# Patient Record
Sex: Female | Born: 1935 | Race: White | Hispanic: No | Smoking: Never smoker
Health system: Southern US, Community
[De-identification: ages and names within clinical notes are randomized; demographics above are authoritative.]

## PROBLEM LIST (undated history)

## (undated) DIAGNOSIS — K219 Gastro-esophageal reflux disease without esophagitis: Secondary | ICD-10-CM

## (undated) DIAGNOSIS — F419 Anxiety disorder, unspecified: Secondary | ICD-10-CM

## (undated) DIAGNOSIS — I10 Essential (primary) hypertension: Principal | ICD-10-CM

## (undated) DIAGNOSIS — F329 Major depressive disorder, single episode, unspecified: Secondary | ICD-10-CM

## (undated) DIAGNOSIS — N882 Stricture and stenosis of cervix uteri: Secondary | ICD-10-CM

## (undated) DIAGNOSIS — G5600 Carpal tunnel syndrome, unspecified upper limb: Secondary | ICD-10-CM

## (undated) DIAGNOSIS — F0280 Dementia in other diseases classified elsewhere without behavioral disturbance: Secondary | ICD-10-CM

## (undated) DIAGNOSIS — R1310 Dysphagia, unspecified: Secondary | ICD-10-CM

## (undated) DIAGNOSIS — F32A Depression, unspecified: Secondary | ICD-10-CM

## (undated) DIAGNOSIS — N189 Chronic kidney disease, unspecified: Secondary | ICD-10-CM

## (undated) DIAGNOSIS — G2581 Restless legs syndrome: Secondary | ICD-10-CM

## (undated) DIAGNOSIS — G609 Hereditary and idiopathic neuropathy, unspecified: Secondary | ICD-10-CM

## (undated) DIAGNOSIS — G309 Alzheimer's disease, unspecified: Secondary | ICD-10-CM

## (undated) HISTORY — DX: Restless legs syndrome: G25.81

## (undated) HISTORY — DX: Major depressive disorder, single episode, unspecified: F32.9

## (undated) HISTORY — DX: Essential (primary) hypertension: I10

## (undated) HISTORY — DX: Dementia in other diseases classified elsewhere, unspecified severity, without behavioral disturbance, psychotic disturbance, mood disturbance, and anxiety: F02.80

## (undated) HISTORY — DX: Gastro-esophageal reflux disease without esophagitis: K21.9

## (undated) HISTORY — DX: Carpal tunnel syndrome, unspecified upper limb: G56.00

## (undated) HISTORY — DX: Chronic kidney disease, unspecified: N18.9

## (undated) HISTORY — DX: Hereditary and idiopathic neuropathy, unspecified: G60.9

## (undated) HISTORY — DX: Anxiety disorder, unspecified: F41.9

## (undated) HISTORY — DX: Alzheimer's disease, unspecified: G30.9

## (undated) HISTORY — DX: Depression, unspecified: F32.A

## (undated) HISTORY — DX: Stricture and stenosis of cervix uteri: N88.2

## (undated) HISTORY — DX: Dysphagia, unspecified: R13.10

---

## 2007-03-23 ENCOUNTER — Encounter: Admission: RE | Admit: 2007-03-23 | Discharge: 2007-03-23 | Payer: Self-pay | Admitting: Family Medicine

## 2008-04-02 ENCOUNTER — Encounter: Admission: RE | Admit: 2008-04-02 | Discharge: 2008-04-02 | Payer: Self-pay | Admitting: Family Medicine

## 2008-04-09 ENCOUNTER — Encounter: Admission: RE | Admit: 2008-04-09 | Discharge: 2008-04-09 | Payer: Self-pay | Admitting: Family Medicine

## 2008-04-18 ENCOUNTER — Encounter: Admission: RE | Admit: 2008-04-18 | Discharge: 2008-04-18 | Payer: Self-pay | Admitting: Family Medicine

## 2009-01-02 ENCOUNTER — Encounter: Admission: RE | Admit: 2009-01-02 | Discharge: 2009-01-02 | Payer: Self-pay | Admitting: Family Medicine

## 2009-04-03 ENCOUNTER — Encounter: Admission: RE | Admit: 2009-04-03 | Discharge: 2009-04-03 | Payer: Self-pay | Admitting: Family Medicine

## 2010-04-29 ENCOUNTER — Encounter: Admission: RE | Admit: 2010-04-29 | Payer: Self-pay | Admitting: Family Medicine

## 2010-07-26 ENCOUNTER — Encounter: Payer: Self-pay | Admitting: Family Medicine

## 2010-07-28 ENCOUNTER — Encounter: Payer: Self-pay | Admitting: Family Medicine

## 2010-07-29 ENCOUNTER — Encounter
Admission: RE | Admit: 2010-07-29 | Discharge: 2010-07-29 | Payer: Self-pay | Source: Home / Self Care | Attending: Family Medicine | Admitting: Family Medicine

## 2010-08-04 ENCOUNTER — Other Ambulatory Visit: Payer: Self-pay | Admitting: Family Medicine

## 2010-08-04 DIAGNOSIS — R928 Other abnormal and inconclusive findings on diagnostic imaging of breast: Secondary | ICD-10-CM

## 2010-08-07 ENCOUNTER — Inpatient Hospital Stay: Admission: RE | Admit: 2010-08-07 | Payer: Self-pay | Source: Ambulatory Visit

## 2010-08-22 ENCOUNTER — Other Ambulatory Visit: Payer: Self-pay

## 2012-10-14 ENCOUNTER — Non-Acute Institutional Stay (SKILLED_NURSING_FACILITY): Payer: Medicare Other | Admitting: Internal Medicine

## 2012-10-14 DIAGNOSIS — G609 Hereditary and idiopathic neuropathy, unspecified: Secondary | ICD-10-CM

## 2012-10-14 DIAGNOSIS — F0280 Dementia in other diseases classified elsewhere without behavioral disturbance: Secondary | ICD-10-CM

## 2012-10-14 DIAGNOSIS — G309 Alzheimer's disease, unspecified: Secondary | ICD-10-CM

## 2012-10-14 DIAGNOSIS — K219 Gastro-esophageal reflux disease without esophagitis: Secondary | ICD-10-CM

## 2012-10-14 DIAGNOSIS — I10 Essential (primary) hypertension: Secondary | ICD-10-CM

## 2012-11-01 NOTE — Progress Notes (Signed)
Patient ID: Beth Petersen, female   DOB: 11-Dec-1926, 79 y.o.   MRN: [id number]        PROGRESS NOTE  DATE:  10/14/2012   FACILITY: [facility]   LEVEL OF CARE: SNF  Routine Visit  CHIEF COMPLAINT:  Manage Alzheimer's dementia and peripheral neuropathy.    HISTORY OF PRESENT ILLNESS:  REASSESSMENT OF ONGOING PROBLEM(S):  DEMENTIA:  The patient is a poor historian.  Dementia is advanced.  The dementia remains stable and continues to function adequately in the current living environment with supervision.  The patient has had little changes in behavior. No complications noted from the medications presently being used.   PERIPHERAL NEUROPATHY: The peripheral neuropathy is stable. The staff denies pain in the feet, tingling, and numbness. No complications noted from the medication presently being used.   PAST MEDICAL HISTORY : Reviewed.  No changes.  CURRENT MEDICATIONS: Reviewed per [facility]  REVIEW OF SYSTEMS:  Unobtainable due to dementia.    PHYSICAL EXAMINATION  VS:  T 97      P  74    RR  20   BP 115/66   POX %     WT (Lb) 156  GENERAL: no acute distress, normal body habitus NECK: supple, trachea midline, no neck masses, no thyroid tenderness, no thyromegaly RESPIRATORY: breathing is even & unlabored, BS CTAB CARDIAC: RRR, no murmur,no extra heart sounds, no edema GI: abdomen soft, normal BS, no masses, no tenderness, no hepatomegaly, no splenomegaly PSYCHIATRIC: the patient is alert & oriented to person, affect & behavior appropriate  LABS/RADIOLOGY: 09/2012:  Potassium 4.1.    CBC normal.   CMP normal.   Depakote level 48.  07/2012:  Vitamin D level 15.6.   ASSESSMENT/PLAN:  Alzheimer's dementia.  Advanced.   Peripheral neuropathy.  Continue Neurontin.    Hypertension.  Well controlled.   GERD.  Well controlled.   Depression.  Continue Paxil.    CPT CODE: 40981

## 2012-11-04 ENCOUNTER — Non-Acute Institutional Stay (SKILLED_NURSING_FACILITY): Payer: Medicare Other | Admitting: Internal Medicine

## 2012-11-04 DIAGNOSIS — I10 Essential (primary) hypertension: Secondary | ICD-10-CM

## 2012-11-04 DIAGNOSIS — F0280 Dementia in other diseases classified elsewhere without behavioral disturbance: Secondary | ICD-10-CM

## 2012-11-04 DIAGNOSIS — G609 Hereditary and idiopathic neuropathy, unspecified: Secondary | ICD-10-CM

## 2012-11-04 DIAGNOSIS — K219 Gastro-esophageal reflux disease without esophagitis: Secondary | ICD-10-CM

## 2012-11-05 NOTE — Progress Notes (Signed)
Patient ID: Beth Petersen, female   DOB: 11-29-1926, 79 y.o.   MRN: [id number]        PROGRESS NOTE  DATE:  11/04/2012   FACILITY: [facility]   LEVEL OF CARE: SNF  Routine Visit  CHIEF COMPLAINT:  Manage Alzheimer's dementia and peripheral neuropathy.    HISTORY OF PRESENT ILLNESS:  REASSESSMENT OF ONGOING PROBLEM(S):  DEMENTIA:  The patient is a poor historian.  Dementia is advanced.  The dementia remains stable and continues to function adequately in the current living environment with supervision.  The patient has had little changes in behavior. No complications noted from the medications presently being used. Pt is a poor historian.  PERIPHERAL NEUROPATHY: The peripheral neuropathy is stable. The staff deny pain in the feet, tingling, and numbness. No complications noted from the medication presently being used.   PAST MEDICAL HISTORY : Reviewed.  No changes.  CURRENT MEDICATIONS: Reviewed per [facility]  REVIEW OF SYSTEMS:  Unobtainable due to dementia.    PHYSICAL EXAMINATION  VS:  T 98      P 72    RR 20   BP 130/82   POX %     WT (Lb) 156  GENERAL: no acute distress, normal body habitus NECK: supple, trachea midline, no neck masses, no thyroid tenderness, no thyromegaly RESPIRATORY: breathing is even & unlabored, BS CTAB CARDIAC: RRR, no murmur,no extra heart sounds, no edema GI: abdomen soft, normal BS, no masses, no tenderness, no hepatomegaly, no splenomegaly PSYCHIATRIC: the patient is alert & oriented to person, affect & behavior appropriate  LABS/RADIOLOGY: 09/2012:  Potassium 4.1.    CBC normal.   CMP normal.   Depakote level 48.  07/2012:  Vitamin D level 15.6.   ASSESSMENT/PLAN:  Alzheimer's dementia.  Advanced.   Peripheral neuropathy.  Continue Neurontin.    Hypertension.  Well controlled.   GERD.  Well controlled.   Depression.  Continue Paxil.    CPT CODE: 82956

## 2012-12-14 ENCOUNTER — Non-Acute Institutional Stay (SKILLED_NURSING_FACILITY): Payer: Medicare Other | Admitting: Internal Medicine

## 2012-12-14 DIAGNOSIS — I10 Essential (primary) hypertension: Secondary | ICD-10-CM

## 2012-12-14 DIAGNOSIS — G609 Hereditary and idiopathic neuropathy, unspecified: Secondary | ICD-10-CM

## 2012-12-14 DIAGNOSIS — F0280 Dementia in other diseases classified elsewhere without behavioral disturbance: Secondary | ICD-10-CM

## 2012-12-14 DIAGNOSIS — K219 Gastro-esophageal reflux disease without esophagitis: Secondary | ICD-10-CM

## 2012-12-16 DIAGNOSIS — K219 Gastro-esophageal reflux disease without esophagitis: Secondary | ICD-10-CM

## 2012-12-16 DIAGNOSIS — I10 Essential (primary) hypertension: Secondary | ICD-10-CM | POA: Insufficient documentation

## 2012-12-16 DIAGNOSIS — G609 Hereditary and idiopathic neuropathy, unspecified: Secondary | ICD-10-CM | POA: Insufficient documentation

## 2012-12-16 DIAGNOSIS — G309 Alzheimer's disease, unspecified: Secondary | ICD-10-CM | POA: Insufficient documentation

## 2012-12-16 HISTORY — DX: Essential (primary) hypertension: I10

## 2012-12-16 HISTORY — DX: Gastro-esophageal reflux disease without esophagitis: K21.9

## 2012-12-16 HISTORY — DX: Hereditary and idiopathic neuropathy, unspecified: G60.9

## 2012-12-16 NOTE — Progress Notes (Signed)
Patient ID: Beth Petersen, female   DOB: [birth year], 79 y.o.   MRN: [id number]        PROGRESS NOTE  DATE:  12/14/2012   FACILITY: [facility]   LEVEL OF CARE: SNF  Routine Visit  CHIEF COMPLAINT:  Manage Alzheimer's dementia and peripheral neuropathy.    HISTORY OF PRESENT ILLNESS:  REASSESSMENT OF ONGOING PROBLEM(S):  DEMENTIA:  The patient is a poor historian.  Dementia is advanced.  The dementia remains stable and continues to function adequately in the current living environment with supervision.  The patient has had little changes in behavior. No complications noted from the medications presently being used. Pt is a poor historian.  PERIPHERAL NEUROPATHY: The peripheral neuropathy is stable. The staff deny pain in the feet, tingling, and numbness. No complications noted from the medication presently being used.   PAST MEDICAL HISTORY : Reviewed.  No changes.  CURRENT MEDICATIONS: Reviewed per [facility]  REVIEW OF SYSTEMS:  Unobtainable due to dementia.    PHYSICAL EXAMINATION  VS:  T 96.7      P 90    RR 16   BP 120/85   POX %     WT (Lb) 155  GENERAL: no acute distress, normal body habitus NECK: supple, trachea midline, no neck masses, no thyroid tenderness, no thyromegaly RESPIRATORY: breathing is even & unlabored, BS CTAB CARDIAC: RRR, no murmur,no extra heart sounds, no edema GI: abdomen soft, normal BS, no masses, no tenderness, no hepatomegaly, no splenomegaly PSYCHIATRIC: the patient is alert & disoriented, affect & behavior appropriate  LABS/RADIOLOGY: 09/2012:  Potassium 4.1.    CBC normal.   CMP normal.   Depakote level 48.  07/2012:  Vitamin D level 15.6.   ASSESSMENT/PLAN:  Alzheimer's dementia.  Advanced.   Peripheral neuropathy.  Continue Neurontin.    Hypertension.  Well controlled.   GERD.  Well controlled.   Depression.  Continue Paxil.    CPT CODE: 40981

## 2013-01-11 ENCOUNTER — Non-Acute Institutional Stay (SKILLED_NURSING_FACILITY): Payer: Medicare Other | Admitting: Internal Medicine

## 2013-01-11 DIAGNOSIS — G609 Hereditary and idiopathic neuropathy, unspecified: Secondary | ICD-10-CM

## 2013-01-11 DIAGNOSIS — K219 Gastro-esophageal reflux disease without esophagitis: Secondary | ICD-10-CM

## 2013-01-11 DIAGNOSIS — I10 Essential (primary) hypertension: Secondary | ICD-10-CM

## 2013-01-11 DIAGNOSIS — G309 Alzheimer's disease, unspecified: Secondary | ICD-10-CM

## 2013-01-11 DIAGNOSIS — F0280 Dementia in other diseases classified elsewhere without behavioral disturbance: Secondary | ICD-10-CM

## 2013-01-13 NOTE — Progress Notes (Signed)
Patient ID: Beth Petersen, female   DOB: 14-May-1927, 79 y.o.   MRN: [id number]        PROGRESS NOTE  DATE:  01/11/2013   FACILITY: [facility]   LEVEL OF CARE: SNF  Routine Visit  CHIEF COMPLAINT:  Manage Alzheimer's dementia and peripheral neuropathy.    HISTORY OF PRESENT ILLNESS:  REASSESSMENT OF ONGOING PROBLEM(S):  DEMENTIA:  The patient is a poor historian.  Dementia is advanced.  The dementia remains stable and continues to function adequately in the current living environment with supervision.  The patient has had little changes in behavior. No complications noted from the medications presently being used. Pt is a poor historian.  PERIPHERAL NEUROPATHY: The peripheral neuropathy is stable. The staff deny pain in the feet, tingling, and numbness. No complications noted from the medication presently being used.   PAST MEDICAL HISTORY : Reviewed.  No changes.  CURRENT MEDICATIONS: Reviewed per [facility]  REVIEW OF SYSTEMS:  Unobtainable due to dementia.    PHYSICAL EXAMINATION  VS:  T 97.1      P 76    RR 18   BP 124/86   POX %     WT (Lb) 157  GENERAL: no acute distress, normal body habitus NECK: supple, trachea midline, no neck masses, no thyroid tenderness, no thyromegaly RESPIRATORY: breathing is even & unlabored, BS CTAB CARDIAC: RRR, no murmur,no extra heart sounds, no edema GI: abdomen soft, normal BS, no masses, no tenderness, no hepatomegaly, no splenomegaly PSYCHIATRIC: the patient is alert & disoriented, affect & behavior appropriate  LABS/RADIOLOGY: 09/2012:  Potassium 4.1.    CBC normal.   CMP normal.   Depakote level 48.  07/2012:  Vitamin D level 15.6.   ASSESSMENT/PLAN:  Alzheimer's dementia.  Advanced.   Peripheral neuropathy.  Continue Neurontin.    Hypertension.  Well controlled.   GERD.  Well controlled.   Depression.  Continue Paxil.    CPT CODE: 08657

## 2013-02-08 ENCOUNTER — Non-Acute Institutional Stay (SKILLED_NURSING_FACILITY): Payer: Medicare Other | Admitting: Internal Medicine

## 2013-02-08 DIAGNOSIS — G609 Hereditary and idiopathic neuropathy, unspecified: Secondary | ICD-10-CM

## 2013-02-08 DIAGNOSIS — G309 Alzheimer's disease, unspecified: Secondary | ICD-10-CM

## 2013-02-08 DIAGNOSIS — I10 Essential (primary) hypertension: Secondary | ICD-10-CM

## 2013-02-08 DIAGNOSIS — K219 Gastro-esophageal reflux disease without esophagitis: Secondary | ICD-10-CM

## 2013-02-08 DIAGNOSIS — F0280 Dementia in other diseases classified elsewhere without behavioral disturbance: Secondary | ICD-10-CM

## 2013-02-08 NOTE — Progress Notes (Signed)
Patient ID: Beth Petersen, female   DOB: 27-Mar-1927, 79 y.o.   MRN: [id number]        PROGRESS NOTE  DATE:  02/08/2013   FACILITY[facility]   LEVEL OF CARE: SNF  Routine Visit  CHIEF COMPLAINT:  Manage Alzheimer's dementia and peripheral neuropathy.    HISTORY OF PRESENT ILLNESS:  REASSESSMENT OF ONGOING PROBLEM(S):  DEMENTIA:  The patient is a poor historian.  Dementia is advanced.  The dementia remains stable and continues to function adequately in the current living environment with supervision.  The patient has had little changes in behavior. No complications noted from the medications presently being used. Pt is a poor historian.  PERIPHERAL NEUROPATHY: The peripheral neuropathy is stable. The staff deny pain in the feet, tingling, and numbness. No complications noted from the medication presently being used.   PAST MEDICAL HISTORY : Reviewed.  No changes.  CURRENT MEDICATIONS: Reviewed per [facility]  REVIEW OF SYSTEMS:  Unobtainable due to dementia.    PHYSICAL EXAMINATION  VS:  T 97.6      P 82    RR 18   BP 102/58   POX %     WT (Lb) 157  GENERAL: no acute distress, normal body habitus NECK: supple, trachea midline, no neck masses, no thyroid tenderness, no thyromegaly RESPIRATORY: breathing is even & unlabored, BS CTAB CARDIAC: RRR, no murmur,no extra heart sounds, no edema GI: abdomen soft, normal BS, no masses, no tenderness, no hepatomegaly, no splenomegaly PSYCHIATRIC: the patient is alert & disoriented, affect & behavior appropriate  LABS/RADIOLOGY: 09/2012:  Potassium 4.1.    CBC normal.   CMP normal.   Depakote level 48.  07/2012:  Vitamin D level 15.6.   ASSESSMENT/PLAN:  Alzheimer's dementia.  Advanced.   Peripheral neuropathy.  Continue Neurontin.    Hypertension.  Well controlled.   GERD.  Well controlled.   Depression.  Continue Paxil.    CPT CODE: 40981

## 2013-03-14 ENCOUNTER — Non-Acute Institutional Stay (SKILLED_NURSING_FACILITY): Payer: Medicare Other | Admitting: Adult Health

## 2013-03-14 DIAGNOSIS — K219 Gastro-esophageal reflux disease without esophagitis: Secondary | ICD-10-CM

## 2013-03-14 DIAGNOSIS — F0280 Dementia in other diseases classified elsewhere without behavioral disturbance: Secondary | ICD-10-CM

## 2013-03-14 DIAGNOSIS — F341 Dysthymic disorder: Secondary | ICD-10-CM

## 2013-03-14 DIAGNOSIS — I10 Essential (primary) hypertension: Secondary | ICD-10-CM

## 2013-03-14 DIAGNOSIS — F418 Other specified anxiety disorders: Secondary | ICD-10-CM

## 2013-03-14 DIAGNOSIS — G609 Hereditary and idiopathic neuropathy, unspecified: Secondary | ICD-10-CM

## 2013-03-14 DIAGNOSIS — G629 Polyneuropathy, unspecified: Secondary | ICD-10-CM

## 2013-03-23 ENCOUNTER — Non-Acute Institutional Stay (SKILLED_NURSING_FACILITY): Payer: Medicare Other | Admitting: Internal Medicine

## 2013-03-23 DIAGNOSIS — R05 Cough: Secondary | ICD-10-CM

## 2013-03-30 ENCOUNTER — Non-Acute Institutional Stay (SKILLED_NURSING_FACILITY): Payer: Medicare Other | Admitting: Internal Medicine

## 2013-03-30 DIAGNOSIS — R05 Cough: Secondary | ICD-10-CM

## 2013-03-30 NOTE — Progress Notes (Signed)
PROGRESS NOTE  DATE: 03/30/2013  FACILITY:  [facility]  LEVEL OF CARE: SNF (31)  Acute Visit  CHIEF COMPLAINT:  Manage cough  HISTORY OF PRESENT ILLNESS: I was requested by the staff to assess the patient regarding above problem(s):  Staff reports that patient is having ongoing cough and congestion despite treatment with Mucinex for one week. Patient is a poor historian due to dementia. Staff cannot identify precipitating or alleviating factors. There are no other associated signs and symptoms. There is no temporal relationship.  PAST MEDICAL HISTORY : Reviewed.  No changes.  CURRENT MEDICATIONS: Reviewed per [facility]  REVIEW OF SYSTEMS: Unobtainable due to dementia  PHYSICAL EXAMINATION  VS:  T 98.9        P 82       RR 20       BP 132/84        GENERAL: no acute distress, normal body habitus NECK: supple, trachea midline, no neck masses, no thyroid tenderness, no thyromegaly LYMPHATICS: no LAN in the neck, no supraclavicular LAN RESPIRATORY: breathing is even & unlabored, BS decreased bilaterally CARDIAC: RRR, no murmur,no extra heart sounds, no edema  ASSESSMENT/PLAN:  Cough-unstable problem. Obtain chest x-ray. Completed Mucinex.  CPT CODE: 16109

## 2013-04-13 ENCOUNTER — Non-Acute Institutional Stay (SKILLED_NURSING_FACILITY): Payer: Medicare Other | Admitting: Internal Medicine

## 2013-04-13 DIAGNOSIS — F0280 Dementia in other diseases classified elsewhere without behavioral disturbance: Secondary | ICD-10-CM

## 2013-04-13 DIAGNOSIS — G609 Hereditary and idiopathic neuropathy, unspecified: Secondary | ICD-10-CM

## 2013-04-13 DIAGNOSIS — K219 Gastro-esophageal reflux disease without esophagitis: Secondary | ICD-10-CM

## 2013-04-13 DIAGNOSIS — I10 Essential (primary) hypertension: Secondary | ICD-10-CM

## 2013-04-20 NOTE — Progress Notes (Signed)
Patient ID: Beth Petersen, female   DOB: [birth year], 79 y.o.   MRN: [id number]        PROGRESS NOTE  DATE: 03/23/2013  FACILITY:  [facility]  LEVEL OF CARE: SNF (31)  Acute Visit  CHIEF COMPLAINT:  Manage cough.    HISTORY OF PRESENT ILLNESS: I was requested by the staff to assess the patient regarding above problem(s):  Staff report that patient is having coughing and congestion as well as a runny nose, noted this morning.  Patient is a poor historian due to dementia.  Staff cannot identify precipitating or alleviating factors.  There is no temporal relationship.  There are no other associated signs and symptoms.    PAST MEDICAL HISTORY : Reviewed.  No changes.  CURRENT MEDICATIONS: Reviewed per [facility]  PHYSICAL EXAMINATION  VS:  T 98.7       P 88      RR 20      BP 124/86     POX %       WT (Lb)  GENERAL: no acute distress, normal body habitus EYES: conjunctivae normal, sclerae normal, normal eye lids NECK: supple, trachea midline, no neck masses, no thyroid tenderness, no thyromegaly LYMPHATICS: no LAN in the neck, no supraclavicular LAN RESPIRATORY: breathing is even & unlabored, BS CTAB; decreased breath sounds   CARDIAC: RRR, no murmur,no extra heart sounds, no edema  ASSESSMENT/PLAN:  Cough.  New problem.  Start Mucinex 600 mg b.i.d. for one week.    CPT CODE: 40981

## 2013-04-21 NOTE — Progress Notes (Signed)
Patient ID: Beth Petersen, female   DOB: 1927-03-07, 79 y.o.   MRN: [id number]        PROGRESS NOTE  DATE:  04/13/2013   [facility]AC[facility]   LEVEL OF CARE: SNF  Routine Visit  CHIEF COMPLAINT:  Manage Alzheimer's dementia and peripheral neuropathy.    HISTORY OF PRESENT ILLNESS:  REASSESSMENT OF ONGOING PROBLEM(S):  DEMENTIA:  The patient is a poor historian.  Dementia is advanced.  The dementia remains stable and continues to function adequately in the current living environment with supervision.  The patient has had little changes in behavior. No complications noted from the medications presently being used. Pt is a poor historian.  PERIPHERAL NEUROPATHY: The peripheral neuropathy is stable. The staff deny pain in the feet, tingling, and numbness. No complications noted from the medication presently being used.   PAST MEDICAL HISTORY : Reviewed.  No changes.  CURRENT MEDICATIONS: Reviewed per [facility]  REVIEW OF SYSTEMS:  Unobtainable due to dementia.    PHYSICAL EXAMINATION  VS:  T 98.3      P 82    RR 18   BP 147/68   POX %     WT (Lb) 153  GENERAL: no acute distress, normal body habitus NECK: supple, trachea midline, no neck masses, no thyroid tenderness, no thyromegaly RESPIRATORY: breathing is even & unlabored, BS CTAB CARDIAC: RRR, no murmur,no extra heart sounds, no edema GI: abdomen soft, normal BS, no masses, no tenderness, no hepatomegaly, no splenomegaly PSYCHIATRIC: the patient is alert & disoriented, affect & behavior appropriate  LABS/RADIOLOGY:  9-14 cbc nl, tp 5.6 ow cmp nl, depakote level 55  09/2012:  Potassium 4.1.    CBC normal.   CMP normal.   Depakote level 48.  07/2012:  Vitamin D level 15.6.   ASSESSMENT/PLAN:  Alzheimer's dementia.  Advanced.   Peripheral neuropathy.  Continue Neurontin.    Hypertension.  Well controlled.   GERD.  Well controlled.   Depression.  Continue Paxil.    CPT CODE: 40981

## 2013-05-11 ENCOUNTER — Non-Acute Institutional Stay (SKILLED_NURSING_FACILITY): Payer: Medicare Other | Admitting: Internal Medicine

## 2013-05-11 DIAGNOSIS — I10 Essential (primary) hypertension: Secondary | ICD-10-CM

## 2013-05-11 DIAGNOSIS — G609 Hereditary and idiopathic neuropathy, unspecified: Secondary | ICD-10-CM

## 2013-05-11 DIAGNOSIS — F0280 Dementia in other diseases classified elsewhere without behavioral disturbance: Secondary | ICD-10-CM

## 2013-05-11 DIAGNOSIS — K219 Gastro-esophageal reflux disease without esophagitis: Secondary | ICD-10-CM

## 2013-05-13 NOTE — Progress Notes (Signed)
Patient ID: Beth Petersen, female   DOB: 12/31/1926, 79 y.o.   MRN: [id number]        PROGRESS NOTE  DATE:  05/11/2013   [facility]ACILITY[facility]   LEVEL OF CARE: SNF  Routine Visit  CHIEF COMPLAINT:  Manage Alzheimer's dementia and peripheral neuropathy.    HISTORY OF PRESENT ILLNESS:  REASSESSMENT OF ONGOING PROBLEM(S):  DEMENTIA:  The patient is a poor historian.  Dementia is advanced.  The dementia remains stable and continues to function adequately in the current living environment with supervision.  The patient has had little changes in behavior. No complications noted from the medications presently being used. Pt is a poor historian.  PERIPHERAL NEUROPATHY: The peripheral neuropathy is stable. The staff deny pain in the feet, tingling, and numbness. No complications noted from the medication presently being used.   PAST MEDICAL HISTORY : Reviewed.  No changes.  CURRENT MEDICATIONS: Reviewed per [facility]  REVIEW OF SYSTEMS:  Unobtainable due to dementia.    PHYSICAL EXAMINATION  VS:  T 98.3      P 72    RR 18   BP 134/62   POX %     WT (Lb) 153  GENERAL: no acute distress, normal body habitus NECK: supple, trachea midline, no neck masses, no thyroid tenderness, no thyromegaly RESPIRATORY: breathing is even & unlabored, BS CTAB CARDIAC: RRR, no murmur,no extra heart sounds, no edema GI: abdomen soft, normal BS, no masses, no tenderness, no hepatomegaly, no splenomegaly PSYCHIATRIC: the patient is alert & disoriented, affect & behavior appropriate  LABS/RADIOLOGY:  9-14 cbc nl, tp 5.6 ow cmp nl, depakote level 55  09/2012:  Potassium 4.1.    CBC normal.   CMP normal.   Depakote level 48.  07/2012:  Vitamin D level 15.6.   ASSESSMENT/PLAN:  Alzheimer's dementia.  Advanced.   Peripheral neuropathy.  Continue Neurontin.    Hypertension.  Well controlled.   GERD.  Well controlled.   Depression.  Continue Paxil.    CPT CODE: 40981

## 2013-06-08 ENCOUNTER — Non-Acute Institutional Stay (SKILLED_NURSING_FACILITY): Payer: Medicare Other | Admitting: Internal Medicine

## 2013-06-08 DIAGNOSIS — F0280 Dementia in other diseases classified elsewhere without behavioral disturbance: Secondary | ICD-10-CM

## 2013-06-08 DIAGNOSIS — I10 Essential (primary) hypertension: Secondary | ICD-10-CM

## 2013-06-08 DIAGNOSIS — K219 Gastro-esophageal reflux disease without esophagitis: Secondary | ICD-10-CM

## 2013-06-08 DIAGNOSIS — G609 Hereditary and idiopathic neuropathy, unspecified: Secondary | ICD-10-CM

## 2013-06-09 ENCOUNTER — Encounter: Payer: Self-pay | Admitting: Internal Medicine

## 2013-06-09 NOTE — Progress Notes (Signed)
Patient ID: Beth Petersen, female   DOB: 02/18/1927, 79 y.o.   MRN: [id number]        PROGRESS NOTE  DATE:  06/08/2013   [facility]ACILITY[facility]   LEVEL OF CARE: SNF  Routine Visit  CHIEF COMPLAINT:  Manage Alzheimer's dementia and peripheral neuropathy.    HISTORY OF PRESENT ILLNESS:  REASSESSMENT OF ONGOING PROBLEM(S):  DEMENTIA:  The patient is a poor historian.  Dementia is advanced.  The dementia remains stable and continues to function adequately in the current living environment with supervision.  The patient has had little changes in behavior. No complications noted from the medications presently being used. Pt is a poor historian.  PERIPHERAL NEUROPATHY: The peripheral neuropathy is stable. The staff deny pain in the feet, tingling, and numbness. No complications noted from the medication presently being used.   PAST MEDICAL HISTORY : Reviewed.  No changes.  CURRENT MEDICATIONS: Reviewed per [facility]  REVIEW OF SYSTEMS:  Unobtainable due to dementia.    PHYSICAL EXAMINATION  VS:  T 98.3      P 72    RR 18   BP 134/62   POX %     WT (Lb) 153  GENERAL: no acute distress, normal body habitus NECK: supple, trachea midline, no neck masses, no thyroid tenderness, no thyromegaly RESPIRATORY: breathing is even & unlabored, BS CTAB CARDIAC: RRR, no murmur,no extra heart sounds, no edema GI: abdomen soft, normal BS, no masses, no tenderness, no hepatomegaly, no splenomegaly PSYCHIATRIC: the patient is alert & disoriented, affect & behavior appropriate  LABS/RADIOLOGY:  9-14 cbc nl, tp 5.6 ow cmp nl, depakote level 55  09/2012:  Potassium 4.1.    CBC normal.   CMP normal.   Depakote level 48.  07/2012:  Vitamin D level 15.6.   ASSESSMENT/PLAN:  Alzheimer's dementia.  Advanced.   Peripheral neuropathy.  Continue Neurontin.    Hypertension.  Well controlled.   GERD.  Well controlled.   Depression.  Continue Paxil.    CPT CODE: 40981

## 2013-06-15 ENCOUNTER — Encounter: Payer: Self-pay | Admitting: Adult Health

## 2013-06-15 NOTE — Progress Notes (Signed)
Patient ID: Beth Petersen, female   DOB: [birth year], 79 y.o.   MRN: [id number]     [place]  Allergies no known allergies    Chief Complaint  Patient presents with  . Medical Managment of Chronic Issues    HPI:  She is being seen for the management of her chronic illnesses. There are no concerns being voiced by the nursing staff at this time she is unable to fully participate in the hpi or ros. There has been no significant change in her recent overall change.   Past Medical History  Diagnosis Date  . Essential hypertension, benign 12/16/2012  . Esophageal reflux 12/16/2012  . Unspecified hereditary and idiopathic peripheral neuropathy 12/16/2012    No past surgical history on file.  VITAL SIGNS BP 132/68  Pulse 70  Ht 5\' 1"  (1.549 m)  Wt 156 lb (70.761 kg)  BMI 29.49 kg/m2   Patient's Medications  New Prescriptions   No medications on file  Previous Medications   AMLODIPINE (NORVASC) 5 MG TABLET    Take 5 mg by mouth daily.   DIVALPROEX (DEPAKOTE SPRINKLE) 125 MG CAPSULE    Take 250 mg by mouth 2 (two) times daily.   GABAPENTIN (NEURONTIN) 600 MG TABLET    Take 600 mg by mouth 3 (three) times daily.   MEMANTINE (NAMENDA) 5 MG TABLET    Take 5 mg by mouth 2 (two) times daily.   OMEPRAZOLE (PRILOSEC) 20 MG CAPSULE    Take 20 mg by mouth daily.   PAROXETINE (PAXIL) 10 MG TABLET    Take 5 mg by mouth daily.  Modified Medications   No medications on file  Discontinued Medications   No medications on file    SIGNIFICANT DIAGNOSTIC EXAMS  LABS REVIEWED  09-17-12: wbc 9.6; hgb 15.6; hct 46.4; mcv 91 plt 289; glucose 85; bun 15; creat 0.77; k+5.3; na++145 Liver normal albumin 3.7; depakote 48 3--20-14: k+ 4.1   Review of Systems  Unable to perform ROS   Physical Exam  Constitutional: She appears well-developed and well-nourished. No distress.  Neck: Neck supple. No JVD present.  Cardiovascular: Normal rate, regular rhythm and intact distal pulses.     Respiratory: Effort normal and breath sounds normal. No respiratory distress. She has no wheezes.  GI: Soft. Bowel sounds are normal. She exhibits no distension. There is no tenderness.  Musculoskeletal: She exhibits no edema.  Is able to move extremities.   Neurological: She is alert.  Skin: Skin is warm and dry. She is not diaphoretic.   ASSESSMENT/PLAN  1. Hypertension: is stable will continue norvasc 5 mg daily   2. Gerd: will continue prilosec 20 mg daily  3. Peripheral neuropathy: will continue neurontin 600 mg tid and will monitor  4. Alzheimer's disease: is without change in status; will continue namenda 5 mg twice daily and will monitor   5,depression with anxiety: will continue paxil 10 mg daily and will continue depakote 250 mg twice daily to help stabilize mood.   Will check cbc;cmp depakote next draw

## 2013-07-03 ENCOUNTER — Non-Acute Institutional Stay (SKILLED_NURSING_FACILITY): Payer: Medicare Other | Admitting: Family

## 2013-07-03 ENCOUNTER — Encounter: Payer: Self-pay | Admitting: Family

## 2013-07-03 DIAGNOSIS — I10 Essential (primary) hypertension: Secondary | ICD-10-CM

## 2013-07-03 DIAGNOSIS — G629 Polyneuropathy, unspecified: Secondary | ICD-10-CM

## 2013-07-03 DIAGNOSIS — G609 Hereditary and idiopathic neuropathy, unspecified: Secondary | ICD-10-CM

## 2013-07-03 DIAGNOSIS — F341 Dysthymic disorder: Secondary | ICD-10-CM

## 2013-07-03 DIAGNOSIS — F418 Other specified anxiety disorders: Secondary | ICD-10-CM

## 2013-07-03 DIAGNOSIS — K219 Gastro-esophageal reflux disease without esophagitis: Secondary | ICD-10-CM

## 2013-07-03 DIAGNOSIS — F0280 Dementia in other diseases classified elsewhere without behavioral disturbance: Secondary | ICD-10-CM

## 2013-07-03 NOTE — Progress Notes (Signed)
    Patient ID: Beth Petersen, female   DOB: 05-11-1927, 79 y.o.   MRN: [id number]  Date: 07/03/13 Facility: [facility]  Code Status:  DNR  Chief Complaint  Patient presents with  . Medical Managment of Chronic Issues    Routine    HPI: Pt is being followed for medical management of chronic illnesses. No issues/concerns by health care team at present.        Allergies  Allergen Reactions  . Aricept [Donepezil Hcl]   . Avelox [Moxifloxacin Hcl In Nacl]   . Penicillins      Medication List       This list is accurate as of: 07/03/13  3:29 PM.  Always use your most recent med list.               amLODipine 5 MG tablet  Commonly known as:  NORVASC  Take 5 mg by mouth daily.     divalproex 125 MG capsule  Commonly known as:  DEPAKOTE SPRINKLE  Take 250 mg by mouth 2 (two) times daily.     gabapentin 600 MG tablet  Commonly known as:  NEURONTIN  Take 600 mg by mouth 3 (three) times daily.     memantine 5 MG tablet  Commonly known as:  NAMENDA  Take 5 mg by mouth 2 (two) times daily.     omeprazole 20 MG capsule  Commonly known as:  PRILOSEC  Take 20 mg by mouth daily.     PARoxetine 10 MG tablet  Commonly known as:  PAXIL  Take 5 mg by mouth daily.         DATA REVIEWED    Laboratory Studies: 03/15/13-WBC 5.8, Hemoglobin 14.5, Hematocrit 42.5, Platelets 257     Past Medical History  Diagnosis Date  . Essential hypertension, benign 12/16/2012  . Esophageal reflux 12/16/2012  . Unspecified hereditary and idiopathic peripheral neuropathy 12/16/2012    Past Medical History  Diagnosis Date  . Essential hypertension, benign 12/16/2012  . Esophageal reflux 12/16/2012  . Unspecified hereditary and idiopathic peripheral neuropathy 12/16/2012      Review of Systems  Unable to perform ROS: dementia     Physical Exam Filed Vitals:   07/03/13 1521  BP: 128/88  Pulse: 76  Temp: 97.9 F (36.6 C)  Resp: 22   There is no weight on file to  calculate BMI. Physical Exam  Cardiovascular: Normal rate, regular rhythm and normal heart sounds.   Pulmonary/Chest: Effort normal and breath sounds normal.  Musculoskeletal:  MOEx 4   Neurological: She is alert. GCS eye subscore is 4. GCS verbal subscore is 4. GCS motor subscore is 6.  Skin: Skin is warm, dry and intact.    ASSESSMENT/PLAN  HTN- will continue Norvasc; pt is currently stable Depression-will continue Paxil; pt is stable on treatment plan Peripheral Neuropathy-will continue Neurontin Dementia-will continue Namenda; pt is currently stable GERD-will continue Prilosec; pt is stable  Follow up:prn

## 2013-07-24 ENCOUNTER — Encounter: Payer: Self-pay | Admitting: *Deleted

## 2013-08-10 ENCOUNTER — Non-Acute Institutional Stay (SKILLED_NURSING_FACILITY): Payer: Medicare Other | Admitting: Internal Medicine

## 2013-08-10 DIAGNOSIS — I10 Essential (primary) hypertension: Secondary | ICD-10-CM

## 2013-08-10 DIAGNOSIS — K219 Gastro-esophageal reflux disease without esophagitis: Secondary | ICD-10-CM

## 2013-08-10 DIAGNOSIS — G609 Hereditary and idiopathic neuropathy, unspecified: Secondary | ICD-10-CM

## 2013-08-10 DIAGNOSIS — G629 Polyneuropathy, unspecified: Secondary | ICD-10-CM

## 2013-08-10 DIAGNOSIS — F0280 Dementia in other diseases classified elsewhere without behavioral disturbance: Secondary | ICD-10-CM

## 2013-08-10 DIAGNOSIS — G309 Alzheimer's disease, unspecified: Principal | ICD-10-CM

## 2013-08-11 DIAGNOSIS — I10 Essential (primary) hypertension: Secondary | ICD-10-CM | POA: Insufficient documentation

## 2013-08-11 NOTE — Progress Notes (Signed)
Patient ID: Beth Petersen, female   DOB: [birth year], 80 y.o.   MRN: [id number]         PROGRESS NOTE  DATE:  08/10/2013   [facility]   LEVEL OF CARE: SNF  Routine Visit  CHIEF COMPLAINT:  Manage Alzheimer's dementia and peripheral neuropathy.    HISTORY OF PRESENT ILLNESS:  REASSESSMENT OF ONGOING PROBLEM(S):  DEMENTIA:  The patient is a poor historian.  Dementia is advanced.  The dementia remains stable and continues to function adequately in the current living environment with supervision.  The patient has had little changes in behavior. No complications noted from the medications presently being used. Pt is a poor historian.  PERIPHERAL NEUROPATHY: The peripheral neuropathy is stable. The staff deny pain in the feet, tingling, and numbness. No complications noted from the medication presently being used.   PAST MEDICAL HISTORY : Reviewed.  No changes.  [facility]  REVIEW OF SYSTEMS:  Unobtainable due to dementia.    PHYSICAL EXAMINATION  VS:  T 97.8      P 76    RR 20   BP 117/83    WT (Lb) 152  GENERAL: no acute distress, normal body habitus NECK: supple, trachea midline, no neck masses, no thyroid tenderness, no thyromegaly RESPIRATORY: breathing is even & unlabored, BS CTAB CARDIAC: RRR, no murmur,no extra heart sounds, no edema GI: abdomen soft, normal BS, no masses, no tenderness, no hepatomegaly, no splenomegaly PSYCHIATRIC: the patient is alert & disoriented, affect & behavior appropriate  LABS/RADIOLOGY:  9-14 cbc nl, tp 5.6 ow cmp nl, depakote level 55  09/2012:  Potassium 4.1.    CBC normal.   CMP normal.   Depakote level 48.  07/2012:  Vitamin D level 15.6.   ASSESSMENT/PLAN:  Alzheimer's dementia.  Advanced.   Peripheral neuropathy.  Continue Neurontin.    Hypertension.  Well controlled.   GERD.  Well controlled.   Depression.  Continue Paxil.    CPT CODE: [id number]

## 2013-08-14 ENCOUNTER — Encounter: Payer: Self-pay | Admitting: *Deleted

## 2013-10-10 ENCOUNTER — Encounter: Payer: Self-pay | Admitting: Adult Health

## 2013-10-10 ENCOUNTER — Non-Acute Institutional Stay (SKILLED_NURSING_FACILITY): Payer: Medicare Other | Admitting: Adult Health

## 2013-10-10 DIAGNOSIS — F0280 Dementia in other diseases classified elsewhere without behavioral disturbance: Secondary | ICD-10-CM

## 2013-10-10 DIAGNOSIS — F418 Other specified anxiety disorders: Secondary | ICD-10-CM

## 2013-10-10 DIAGNOSIS — G629 Polyneuropathy, unspecified: Secondary | ICD-10-CM

## 2013-10-10 DIAGNOSIS — F341 Dysthymic disorder: Secondary | ICD-10-CM

## 2013-10-10 DIAGNOSIS — G609 Hereditary and idiopathic neuropathy, unspecified: Secondary | ICD-10-CM

## 2013-10-10 DIAGNOSIS — I10 Essential (primary) hypertension: Secondary | ICD-10-CM

## 2013-10-10 DIAGNOSIS — K219 Gastro-esophageal reflux disease without esophagitis: Secondary | ICD-10-CM

## 2013-10-10 DIAGNOSIS — G309 Alzheimer's disease, unspecified: Secondary | ICD-10-CM

## 2013-10-10 NOTE — Progress Notes (Signed)
Patient ID: Beth Petersen, female   DOB: 03/31/1927, 80 y.o.   MRN: [id number]     [place]  Allergies  Allergen Reactions  . Aricept [Donepezil Hcl]   . Avelox [Moxifloxacin Hcl In Nacl]   . Penicillins       Chief Complaint  Patient presents with  . Medical Managment of Chronic Issues    HPI:  She is being seen for the management of her chronic illnesses. Overall there is little change in her recent status. There are no concerns being voiced by the nursing staff at this time. She is unable to fully participate in the hpi or ros.   Past Medical History  Diagnosis Date  . Essential hypertension, benign 12/16/2012  . Esophageal reflux 12/16/2012  . Unspecified hereditary and idiopathic peripheral neuropathy 12/16/2012  . Depression   . Anxiety   . Alzheimer disease   . Chronic kidney disease     hx of nephrolithasis  . Restless leg syndrome   . Cervical os stenosis   . Dysphagia   . Carpal tunnel syndrome     No past surgical history on file.  VITAL SIGNS BP 134/72  Pulse 68  Ht 5\' 1"  (1.549 m)  Wt 154 lb (69.854 kg)  BMI 29.11 kg/m2   Patient's Medications  New Prescriptions   No medications on file  Previous Medications   ACETAMINOPHEN (TYLENOL) 325 MG TABLET    Take 650 mg by mouth every 4 (four) hours as needed.   AMLODIPINE (NORVASC) 5 MG TABLET    Take 5 mg by mouth daily.   DIVALPROEX (DEPAKOTE SPRINKLE) 125 MG CAPSULE    Take 250 mg by mouth 2 (two) times daily.   GABAPENTIN (NEURONTIN) 600 MG TABLET    Take 600 mg by mouth 3 (three) times daily.   MEMANTINE (NAMENDA) 5 MG TABLET    Take 5 mg by mouth 2 (two) times daily.   OMEPRAZOLE (PRILOSEC) 20 MG CAPSULE    Take 20 mg by mouth daily.  Modified Medications   No medications on file  Discontinued Medications   PAROXETINE (PAXIL) 10 MG TABLET    Take 10 mg by mouth daily. For depression.    SIGNIFICANT DIAGNOSTIC EXAMS    LABS REVIEWED:   09-14-13: wbc 10.8; hgb 14.5; hct 42.7; mcv 91 plt  316; glucose 124; bun 14; creat 0.77; k+4.4; na++144     Review of Systems  Unable to perform ROS    Physical Exam  Constitutional: She appears well-developed and well-nourished. No distress.  Neck: Neck supple. No JVD present. No thyromegaly present.  Cardiovascular: Normal rate, regular rhythm and intact distal pulses.   Respiratory: Effort normal and breath sounds normal. No respiratory distress. She has no wheezes.  GI: Soft. Bowel sounds are normal. She exhibits no distension. There is no tenderness.  Musculoskeletal: Normal range of motion. She exhibits no edema.  Neurological: She is alert.  Skin: Skin is warm and dry. She is not diaphoretic.       ASSESSMENT/ PLAN:  1. Hypertension: is stable will continue norvasc 5 mg daily and will monitor  2. Alzheimer's disease: no significant in status; will continue namenda 5 mg twice daily. Will monitor her status   3. Depression with anxiety: is off her paxil; is taking depakote sprinkles 250 mg twice daily to help stabilize mood and will monitor is followed by nceps.   4. Genella Rife: will continue prilosec 20 mg daily   5. Peripheral neuropathy: no indications of  pain present; will continue neurontin 600 mg three times daily and will monitor         Synthia Innocenteborah Green NP [facility]  Contact [phone] Monday through Friday 8am- 5pm  After hours call [phone]

## 2013-11-29 ENCOUNTER — Non-Acute Institutional Stay (SKILLED_NURSING_FACILITY): Payer: Medicare Other | Admitting: Internal Medicine

## 2013-11-29 DIAGNOSIS — G629 Polyneuropathy, unspecified: Secondary | ICD-10-CM

## 2013-11-29 DIAGNOSIS — K219 Gastro-esophageal reflux disease without esophagitis: Secondary | ICD-10-CM

## 2013-11-29 DIAGNOSIS — F0280 Dementia in other diseases classified elsewhere without behavioral disturbance: Secondary | ICD-10-CM

## 2013-11-29 DIAGNOSIS — I10 Essential (primary) hypertension: Secondary | ICD-10-CM

## 2013-11-29 DIAGNOSIS — G309 Alzheimer's disease, unspecified: Principal | ICD-10-CM

## 2013-11-29 DIAGNOSIS — G609 Hereditary and idiopathic neuropathy, unspecified: Secondary | ICD-10-CM

## 2013-11-30 NOTE — Progress Notes (Signed)
Patient ID: Beth Petersen, female   DOB: September 17, 1926, 80 y.o.   MRN: [id number]         PROGRESS NOTE  DATE:  11/29/2013   [facility]   LEVEL OF CARE: SNF  Routine Visit  CHIEF COMPLAINT:  Manage Alzheimer's dementia, hypertension and peripheral neuropathy.    HISTORY OF PRESENT ILLNESS:  REASSESSMENT OF ONGOING PROBLEM(S):  DEMENTIA:  The patient is a poor historian.  Dementia is advanced.  The dementia remains stable and continues to function adequately in the current living environment with supervision.  The patient has had little changes in behavior. No complications noted from the medications presently being used. Pt is a poor historian.  PERIPHERAL NEUROPATHY: The peripheral neuropathy is stable. The staff deny pain in the feet, tingling, and numbness. No complications noted from the medication presently being used.   HTN: Pt 's HTN remains stable.  Staff Deny CP, sob, DOE, pedal edema, headaches, dizziness or visual disturbances.  No complications from the medications currently being used.  Last BP : 100/78  PAST MEDICAL HISTORY : Reviewed.  No changes.  CURRENT MEDICATIONS: Reviewed per [facility]  REVIEW OF SYSTEMS:  Unobtainable due to dementia.    PHYSICAL EXAMINATION  VS: see VS section  GENERAL: no acute distress, normal body habitus EYES: Normal sclerae, normal conjunctivae, no discharge NECK: supple, trachea midline, no neck masses, no thyroid tenderness, no thyromegaly LYMPHATICS: No cervical lymphadenopathy, no supraclavicular lymphadenopathy RESPIRATORY: breathing is even & unlabored, BS CTAB CARDIAC: RRR, no murmur,no extra heart sounds,  +1 bilateral lower extremity edema GI: abdomen soft, normal BS, no masses, no tenderness, no hepatomegaly, no splenomegaly PSYCHIATRIC: the patient is alert & disoriented, affect & behavior appropriate  LABS/RADIOLOGY: 3-15 CBC and CMP normal, Depakote level 35 9-14 cbc nl, tp 5.6 ow cmp nl, depakote level  55  09/2012:  Potassium 4.1.    CBC normal.   CMP normal.   Depakote level 48.  07/2012:  Vitamin D level 15.6.   ASSESSMENT/PLAN:  Alzheimer's dementia.  Advanced. Depakote was increased  Peripheral neuropathy.  Continue Neurontin.    Hypertension.  Well controlled.   GERD.  Well controlled.  Check Depakote level  CPT CODE: 69678  Newton Pigg. Kerry Dory, MD [facility] [phone]

## 2013-12-02 ENCOUNTER — Emergency Department (HOSPITAL_COMMUNITY)
Admission: EM | Admit: 2013-12-02 | Discharge: 2013-12-03 | Disposition: A | Discharge status: Home / Self Care | Payer: Medicare Other | Attending: Emergency Medicine | Admitting: Emergency Medicine

## 2013-12-02 ENCOUNTER — Encounter (HOSPITAL_COMMUNITY): Payer: Self-pay | Admitting: Emergency Medicine

## 2013-12-02 DIAGNOSIS — G309 Alzheimer's disease, unspecified: Secondary | ICD-10-CM | POA: Insufficient documentation

## 2013-12-02 DIAGNOSIS — R296 Repeated falls: Secondary | ICD-10-CM | POA: Insufficient documentation

## 2013-12-02 DIAGNOSIS — W19XXXA Unspecified fall, initial encounter: Secondary | ICD-10-CM

## 2013-12-02 DIAGNOSIS — Y9210 Unspecified residential institution as the place of occurrence of the external cause: Secondary | ICD-10-CM | POA: Insufficient documentation

## 2013-12-02 DIAGNOSIS — Y939 Activity, unspecified: Secondary | ICD-10-CM | POA: Insufficient documentation

## 2013-12-02 DIAGNOSIS — F329 Major depressive disorder, single episode, unspecified: Secondary | ICD-10-CM | POA: Insufficient documentation

## 2013-12-02 DIAGNOSIS — F3289 Other specified depressive episodes: Secondary | ICD-10-CM | POA: Insufficient documentation

## 2013-12-02 DIAGNOSIS — I10 Essential (primary) hypertension: Secondary | ICD-10-CM | POA: Insufficient documentation

## 2013-12-02 DIAGNOSIS — Z043 Encounter for examination and observation following other accident: Secondary | ICD-10-CM | POA: Insufficient documentation

## 2013-12-02 DIAGNOSIS — F0280 Dementia in other diseases classified elsewhere without behavioral disturbance: Secondary | ICD-10-CM | POA: Insufficient documentation

## 2013-12-02 DIAGNOSIS — Z87442 Personal history of urinary calculi: Secondary | ICD-10-CM | POA: Insufficient documentation

## 2013-12-02 DIAGNOSIS — F411 Generalized anxiety disorder: Secondary | ICD-10-CM | POA: Insufficient documentation

## 2013-12-02 DIAGNOSIS — N189 Chronic kidney disease, unspecified: Secondary | ICD-10-CM | POA: Insufficient documentation

## 2013-12-02 DIAGNOSIS — K219 Gastro-esophageal reflux disease without esophagitis: Secondary | ICD-10-CM | POA: Insufficient documentation

## 2013-12-02 DIAGNOSIS — Z880 Allergy status to penicillin: Secondary | ICD-10-CM | POA: Insufficient documentation

## 2013-12-02 DIAGNOSIS — Y92129 Unspecified place in nursing home as the place of occurrence of the external cause: Secondary | ICD-10-CM

## 2013-12-02 DIAGNOSIS — Z79899 Other long term (current) drug therapy: Secondary | ICD-10-CM | POA: Insufficient documentation

## 2013-12-02 DIAGNOSIS — Z8742 Personal history of other diseases of the female genital tract: Secondary | ICD-10-CM | POA: Insufficient documentation

## 2013-12-02 NOTE — ED Notes (Signed)
Bed: IL57 Expected date: 12/02/13 Expected time: 11:06 PM Means of arrival: Ambulance Comments: Fall, hip pain

## 2013-12-02 NOTE — ED Notes (Signed)
PA-C at bedside 

## 2013-12-02 NOTE — ED Notes (Signed)
Family at bedside. 

## 2013-12-02 NOTE — ED Provider Notes (Signed)
CSN: [id number]     Arrival date & time 12/02/13  2333 History   First MD Initiated Contact with Patient 12/02/13 2340     Chief Complaint  Patient presents with  . Fall     (Consider location/radiation/quality/duration/timing/severity/associated sxs/prior Treatment) HPI  Beth Petersen is a 80 y.o. female with advanced Alzheimer's disease, chronic kidney disease, dysphasia and peripheral neuropathy brought in by EMS from [facility] with report of a witnessed fall, no head trauma, LOC. Patient impacted right hip. Level V caveat secondary to dementia. Patient is accompanied by her daughter: States that she walks independently and does not require a walker. No advanced directives or DNR. As per daughter she is mentating at her baseline.  Past Medical History  Diagnosis Date  . Essential hypertension, benign 12/16/2012  . Esophageal reflux 12/16/2012  . Unspecified hereditary and idiopathic peripheral neuropathy 12/16/2012  . Depression   . Anxiety   . Alzheimer disease   . Chronic kidney disease     hx of nephrolithasis  . Restless leg syndrome   . Cervical os stenosis   . Dysphagia   . Carpal tunnel syndrome    History reviewed. No pertinent past surgical history. History reviewed. No pertinent family history. History  Substance Use Topics  . Smoking status: Never Smoker   . Smokeless tobacco: Not on file  . Alcohol Use: Not on file   OB History   Grav Para Term Preterm Abortions TAB SAB Ect Mult Living                 Review of Systems  Unable to perform ROS: Dementia      Allergies  Aricept; Avelox; and Penicillins  Home Medications   Prior to Admission medications   Medication Sig Start Date End Date Taking? Authorizing Provider  acetaminophen (TYLENOL) 325 MG tablet Take 650 mg by mouth every 4 (four) hours as needed.   Yes Historical Provider, MD  amLODipine (NORVASC) 5 MG tablet Take 5 mg by mouth daily.   Yes Historical Provider, MD   divalproex (DEPAKOTE SPRINKLE) 125 MG capsule Take 375 mg by mouth 2 (two) times daily.    Yes Historical Provider, MD  gabapentin (NEURONTIN) 600 MG tablet Take 600 mg by mouth 3 (three) times daily.   Yes Historical Provider, MD  memantine (NAMENDA) 5 MG tablet Take 5 mg by mouth 2 (two) times daily.   Yes Historical Provider, MD  omeprazole (PRILOSEC) 20 MG capsule Take 20 mg by mouth daily.   Yes Historical Provider, MD   BP 144/70  Pulse 71  Temp(Src) 98 F (36.7 C) (Oral)  Resp 18  SpO2 92% Physical Exam  Nursing note and vitals reviewed. Constitutional: She is oriented to person, place, and time. She appears well-developed and well-nourished. No distress.  HENT:  Head: Normocephalic and atraumatic.  Mouth/Throat: Oropharynx is clear and moist.  Eyes: Conjunctivae and EOM are normal. Pupils are equal, round, and reactive to light.  Neck: Normal range of motion. Neck supple.  Cardiovascular: Normal rate, regular rhythm and intact distal pulses.   Pulmonary/Chest: Effort normal and breath sounds normal. No stridor. No respiratory distress. She has no wheezes. She has no rales. She exhibits no tenderness.  Abdominal: Soft. Bowel sounds are normal. She exhibits no distension and no mass. There is no tenderness. There is no rebound and no guarding.  Musculoskeletal: Normal range of motion. She exhibits no edema and no tenderness.  Legs is equal length. No rotation. Distal  pulses intact  Neurological: She is alert and oriented to person, place, and time.  Psychiatric:  Nonverbal, does not follow commands, eyes closed    ED Course  Procedures (including critical care time) Labs Review Labs Reviewed  CBC WITH DIFFERENTIAL - Abnormal; Notable for the following:    WBC 14.4 (*)    Neutro Abs 10.0 (*)    Monocytes Absolute 1.4 (*)    All other components within normal limits  BASIC METABOLIC PANEL - Abnormal; Notable for the following:    Glucose, Bld 108 (*)    GFR calc non Af  Amer 77 (*)    GFR calc Af Amer 89 (*)    All other components within normal limits  HEPATIC FUNCTION PANEL - Abnormal; Notable for the following:    Total Bilirubin 0.2 (*)    All other components within normal limits  URINALYSIS, ROUTINE W REFLEX MICROSCOPIC - Abnormal; Notable for the following:    Hgb urine dipstick SMALL (*)    All other components within normal limits  URINE MICROSCOPIC-ADD ON - Abnormal; Notable for the following:    Squamous Epithelial / LPF FEW (*)    All other components within normal limits  LIPASE, BLOOD    Imaging Review Dg Pelvis Portable  12/03/2013   CLINICAL DATA:  Fall with left-sided hip pain  EXAM: PORTABLE PELVIS 1-2 VIEWS  COMPARISON:  None.  FINDINGS: There is no evidence of pelvic fracture or diastasis. Diffuse degenerative insertional ossification. Chronic appearing ossified body overlapping the upper right femoral neck.  IMPRESSION: No acute osseous abnormality.   Electronically Signed   By: Tiburcio PeaJonathan  Watts M.D.   On: 12/03/2013 00:26   Dg Chest Port 1 View  12/03/2013   CLINICAL DATA:  80 year old female with altered mental status and shortness of Breath. Initial encounter.  EXAM: PORTABLE CHEST - 1 VIEW  COMPARISON:  None.  FINDINGS: Portable AP semi upright view at 0020 hrs. Lung volumes are within normal limits. Normal cardiac size and mediastinal contours. Visualized tracheal air column is within normal limits. Allowing for portable technique, the lungs are clear. No pneumothorax or effusion.  IMPRESSION: No acute cardiopulmonary abnormality.   Electronically Signed   By: Augusto GambleLee  Hall M.D.   On: 12/03/2013 00:27     EKG Interpretation None      MDM   Final diagnoses:  Fall at nursing home    Filed Vitals:   12/02/13 2333 12/02/13 2335 12/03/13 0246  BP:  148/62 144/70  Pulse:  75 71  Temp:  98 F (36.7 C)   TempSrc:  Oral   Resp:  20 18  SpO2: 94% 98% 92%    [name] is a 80 y.o. female presenting with fall at skilled  nursing facility. Physical exam with no abnormalities, patient unable to supply history secondary to severe dementia. Urinalysis is not consistent with infection. CBC remarkable only for a leukocytosis of 14.4. Chest x-ray with no infiltrate and pelvis x-ray with no fracture. Results discussed with patient's daughter. No indication for admission at this time.  Evaluation does not show pathology that would require ongoing emergent intervention or inpatient treatment. Pt is hemodynamically stable and mentating appropriately. Discussed findings and plan with patient/guardian, who agrees with care plan. All questions answered. Return precautions discussed and outpatient follow up given.   This is a shared visit with the attending physician who personally evaluated the patient and agrees with the care plan.   Note: Portions of this report may have  been transcribed using voice recognition software. Every effort was made to ensure accuracy; however, inadvertent computerized transcription errors may be present     Wynetta Emery, PA-C 12/03/13 0359  Wynetta Emery, PA-C 12/03/13 [phone]

## 2013-12-02 NOTE — ED Notes (Signed)
Pt presented by EMS from [facility]. Report of witnessed fall, no head impact, no LOC, impact on right hip, no deformity noted on the affected hip, positive pedal pulses, no shortening or rotation of affected extremity on assessment. Pt asleep on arrival, no signs of distress at this time.

## 2013-12-03 ENCOUNTER — Emergency Department (HOSPITAL_COMMUNITY): Payer: Medicare Other

## 2013-12-03 LAB — CBC WITH DIFFERENTIAL/PLATELET
BASOS ABS: 0.1 10*3/uL (ref 0.0–0.1)
Basophils Relative: 0 % (ref 0–1)
EOS PCT: 3 % (ref 0–5)
Eosinophils Absolute: 0.4 10*3/uL (ref 0.0–0.7)
HEMATOCRIT: 42.1 % (ref 36.0–46.0)
HEMOGLOBIN: 14.2 g/dL (ref 12.0–15.0)
LYMPHS ABS: 2.5 10*3/uL (ref 0.7–4.0)
LYMPHS PCT: 17 % (ref 12–46)
MCH: 30.3 pg (ref 26.0–34.0)
MCHC: 33.7 g/dL (ref 30.0–36.0)
MCV: 89.8 fL (ref 78.0–100.0)
MONO ABS: 1.4 10*3/uL — AB (ref 0.1–1.0)
MONOS PCT: 10 % (ref 3–12)
NEUTROS ABS: 10 10*3/uL — AB (ref 1.7–7.7)
Neutrophils Relative %: 70 % (ref 43–77)
Platelets: 276 10*3/uL (ref 150–400)
RBC: 4.69 MIL/uL (ref 3.87–5.11)
RDW: 13.7 % (ref 11.5–15.5)
WBC: 14.4 10*3/uL — AB (ref 4.0–10.5)

## 2013-12-03 LAB — HEPATIC FUNCTION PANEL
ALK PHOS: 59 U/L (ref 39–117)
AST: 14 U/L (ref 0–37)
Albumin: 3.5 g/dL (ref 3.5–5.2)
BILIRUBIN TOTAL: 0.2 mg/dL — AB (ref 0.3–1.2)
Bilirubin, Direct: <0.2 mg/dL (ref 0.0–0.3)
TOTAL PROTEIN: 6.9 g/dL (ref 6.0–8.3)

## 2013-12-03 LAB — BASIC METABOLIC PANEL
BUN: 12 mg/dL (ref 6–23)
CHLORIDE: 104 meq/L (ref 96–112)
CO2: 28 meq/L (ref 19–32)
CREATININE: 0.72 mg/dL (ref 0.50–1.10)
Calcium: 9.5 mg/dL (ref 8.4–10.5)
GFR calc Af Amer: 89 mL/min — ABNORMAL LOW (ref >90)
GFR calc non Af Amer: 77 mL/min — ABNORMAL LOW (ref >90)
GLUCOSE: 108 mg/dL — AB (ref 70–99)
Potassium: 4.4 mEq/L (ref 3.7–5.3)
Sodium: 144 mEq/L (ref 137–147)

## 2013-12-03 LAB — URINALYSIS, ROUTINE W REFLEX MICROSCOPIC
BILIRUBIN URINE: NEGATIVE
Glucose, UA: NEGATIVE mg/dL
KETONES UR: NEGATIVE mg/dL
Leukocytes, UA: NEGATIVE
Nitrite: NEGATIVE
PH: 7.5 (ref 5.0–8.0)
Protein, ur: NEGATIVE mg/dL
SPECIFIC GRAVITY, URINE: 1.01 (ref 1.005–1.030)
UROBILINOGEN UA: 1 mg/dL (ref 0.0–1.0)

## 2013-12-03 LAB — URINE MICROSCOPIC-ADD ON

## 2013-12-03 LAB — LIPASE, BLOOD: Lipase: 56 U/L (ref 11–59)

## 2013-12-03 NOTE — ED Provider Notes (Signed)
Medical screening examination/treatment/procedure(s) were conducted as a shared visit with non-physician practitioner(s) and myself.  I personally evaluated the patient during the encounter.   EKG Interpretation None     Pt with fall at nursing home, has h/o same.  Workup here unremarkable.  Olivia Mackie, MD 12/03/13 [phone]

## 2013-12-03 NOTE — Discharge Instructions (Signed)
Please follow with your primary care doctor in the next 2 days for a check-up. They must obtain records for further management.  ° °Do not hesitate to return to the Emergency Department for any new, worsening or concerning symptoms.  ° ° °Fall Prevention and Home Safety °Falls cause injuries and can affect all age groups. It is possible to use preventive measures to significantly decrease the likelihood of falls. There are many simple measures which can make your home safer and prevent falls. °OUTDOORS °· Repair cracks and edges of walkways and driveways. °· Remove high doorway thresholds. °· Trim shrubbery on the main path into your home. °· Have good outside lighting. °· Clear walkways of tools, rocks, debris, and clutter. °· Check that handrails are not broken and are securely fastened. Both sides of steps should have handrails. °· Have leaves, snow, and ice cleared regularly. °· Use sand or salt on walkways during winter months. °· In the garage, clean up grease or oil spills. °BATHROOM °· Install night lights. °· Install grab bars by the toilet and in the tub and shower. °· Use non-skid mats or decals in the tub or shower. °· Place a plastic non-slip stool in the shower to sit on, if needed. °· Keep floors dry and clean up all water on the floor immediately. °· Remove soap buildup in the tub or shower on a regular basis. °· Secure bath mats with non-slip, double-sided rug tape. °· Remove throw rugs and tripping hazards from the floors. °BEDROOMS °· Install night lights. °· Make sure a bedside light is easy to reach. °· Do not use oversized bedding. °· Keep a telephone by your bedside. °· Have a firm chair with side arms to use for getting dressed. °· Remove throw rugs and tripping hazards from the floor. °KITCHEN °· Keep handles on pots and pans turned toward the center of the stove. Use back burners when possible. °· Clean up spills quickly and allow time for drying. °· Avoid walking on wet floors. °· Avoid  hot utensils and knives. °· Position shelves so they are not too high or low. °· Place commonly used objects within easy reach. °· If necessary, use a sturdy step stool with a grab bar when reaching. °· Keep electrical cables out of the way. °· Do not use floor polish or wax that makes floors slippery. If you must use wax, use non-skid floor wax. °· Remove throw rugs and tripping hazards from the floor. °STAIRWAYS °· Never leave objects on stairs. °· Place handrails on both sides of stairways and use them. Fix any loose handrails. Make sure handrails on both sides of the stairways are as long as the stairs. °· Check carpeting to make sure it is firmly attached along stairs. Make repairs to worn or loose carpet promptly. °· Avoid placing throw rugs at the top or bottom of stairways, or properly secure the rug with carpet tape to prevent slippage. Get rid of throw rugs, if possible. °· Have an electrician put in a light switch at the top and bottom of the stairs. °OTHER FALL PREVENTION TIPS °· Wear low-heel or rubber-soled shoes that are supportive and fit well. Wear closed toe shoes. °· When using a stepladder, make sure it is fully opened and both spreaders are firmly locked. Do not climb a closed stepladder. °· Add color or contrast paint or tape to grab bars and handrails in your home. Place contrasting color strips on first and last steps. °· Learn and use   mobility aids as needed. Install an electrical emergency response system. °· Turn on lights to avoid dark areas. Replace light bulbs that burn out immediately. Get light switches that glow. °· Arrange furniture to create clear pathways. Keep furniture in the same place. °· Firmly attach carpet with non-skid or double-sided tape. °· Eliminate uneven floor surfaces. °· Select a carpet pattern that does not visually hide the edge of steps. °· Be aware of all pets. °OTHER HOME SAFETY TIPS °· Set the water temperature for 120° F (48.8° C). °· Keep emergency numbers  on or near the telephone. °· Keep smoke detectors on every level of the home and near sleeping areas. °Document Released: 06/12/2002 Document Revised: 12/22/2011 Document Reviewed: 09/11/2011 °[employer]® Patient Information ©2014 [employer]. ° °

## 2013-12-20 ENCOUNTER — Non-Acute Institutional Stay (SKILLED_NURSING_FACILITY): Payer: Medicare Other | Admitting: Internal Medicine

## 2013-12-20 DIAGNOSIS — K219 Gastro-esophageal reflux disease without esophagitis: Secondary | ICD-10-CM

## 2013-12-20 DIAGNOSIS — G629 Polyneuropathy, unspecified: Secondary | ICD-10-CM

## 2013-12-20 DIAGNOSIS — I10 Essential (primary) hypertension: Secondary | ICD-10-CM

## 2013-12-20 DIAGNOSIS — F0280 Dementia in other diseases classified elsewhere without behavioral disturbance: Secondary | ICD-10-CM

## 2013-12-20 DIAGNOSIS — G609 Hereditary and idiopathic neuropathy, unspecified: Secondary | ICD-10-CM

## 2013-12-20 DIAGNOSIS — G309 Alzheimer's disease, unspecified: Principal | ICD-10-CM

## 2013-12-21 NOTE — Progress Notes (Signed)
Patient ID: Beth DerBetty Petersen, female   DOB: 1927-02-05, 80 y.o.   MRN: [id number]         PROGRESS NOTE  DATE:  12/20/2013   [facility]   LEVEL OF CARE: SNF  Routine Visit  CHIEF COMPLAINT:  Manage Alzheimer's dementia, hypertension and peripheral neuropathy.    HISTORY OF PRESENT ILLNESS:  REASSESSMENT OF ONGOING PROBLEM(S):  DEMENTIA:  The patient is a poor historian.  Dementia is advanced.  The dementia remains stable and continues to function adequately in the current living environment with supervision.  The patient has had little changes in behavior. No complications noted from the medications presently being used. Pt is a poor historian.  PERIPHERAL NEUROPATHY: The peripheral neuropathy is stable. The staff deny pain in the feet, tingling, and numbness. No complications noted from the medication presently being used.   HTN: Pt 's HTN remains stable.  Staff Deny CP, sob, DOE, pedal edema, headaches, dizziness or visual disturbances.  No complications from the medications currently being used.  Last BP : 100/78, 114/72  PAST MEDICAL HISTORY : Reviewed.  No changes.  [facility]  REVIEW OF SYSTEMS:  Unobtainable due to dementia.    PHYSICAL EXAMINATION  VS: see VS section  GENERAL: no acute distress, normal body habitus NECK: supple, trachea midline, no neck masses, no thyroid tenderness, no thyromegaly RESPIRATORY: breathing is even & unlabored, BS CTAB CARDIAC: RRR, no murmur,no extra heart sounds,  +1 bilateral lower extremity edema GI: abdomen soft, normal BS, no masses, no tenderness, no hepatomegaly, no splenomegaly PSYCHIATRIC: the patient is alert & disoriented, affect & behavior appropriate  LABS/RADIOLOGY: 6-15 Depakote level 42 3-15 CBC and CMP normal, Depakote level 35 9-14 cbc nl, tp 5.6 ow cmp nl, depakote level 55  09/2012:  Potassium 4.1.    CBC normal.   CMP normal.   Depakote level 48.  07/2012:  Vitamin D level  15.6.   ASSESSMENT/PLAN:  Alzheimer's dementia.  Advanced. Depakote was increased  Peripheral neuropathy.  Continue Neurontin.    Hypertension.  Well controlled.   GERD.  Well controlled.  Anxiety-trazodone was started  CPT CODE: [id number]  Newton PiggGayani Y. Kerry Doryasanayaka, MD [facility] [phone]

## 2014-01-17 ENCOUNTER — Non-Acute Institutional Stay (SKILLED_NURSING_FACILITY): Payer: Medicare Other | Admitting: Internal Medicine

## 2014-01-17 DIAGNOSIS — G309 Alzheimer's disease, unspecified: Principal | ICD-10-CM

## 2014-01-17 DIAGNOSIS — F0280 Dementia in other diseases classified elsewhere without behavioral disturbance: Secondary | ICD-10-CM

## 2014-01-17 DIAGNOSIS — I10 Essential (primary) hypertension: Secondary | ICD-10-CM

## 2014-01-17 DIAGNOSIS — K219 Gastro-esophageal reflux disease without esophagitis: Secondary | ICD-10-CM

## 2014-01-17 DIAGNOSIS — G629 Polyneuropathy, unspecified: Secondary | ICD-10-CM

## 2014-01-17 DIAGNOSIS — G609 Hereditary and idiopathic neuropathy, unspecified: Secondary | ICD-10-CM

## 2014-01-18 NOTE — Progress Notes (Signed)
Patient ID: [name], female   DOB: Aug 03, 1926, 80 y.o.   MRN: [id number]         PROGRESS NOTE  DATE:  01/17/2014   [facility]   LEVEL OF CARE: SNF  Routine Visit  CHIEF COMPLAINT:  Manage Alzheimer's dementia, hypertension and peripheral neuropathy.    HISTORY OF PRESENT ILLNESS:  REASSESSMENT OF ONGOING PROBLEM(S):  DEMENTIA:  The patient is a poor historian.  Dementia is advanced.  The dementia remains stable and continues to function adequately in the current living environment with supervision.  The patient has had little changes in behavior. No complications noted from the medications presently being used. Pt is a poor historian.  PERIPHERAL NEUROPATHY: The peripheral neuropathy is stable. The staff deny pain in the feet, tingling, and numbness. No complications noted from the medication presently being used.   HTN: Pt 's HTN remains stable.  Staff Deny CP, sob, DOE, pedal edema, headaches, dizziness or visual disturbances.  No complications from the medications currently being used.  Last BP : 100/78, 114/72, 128/80  PAST MEDICAL HISTORY : Reviewed.  No changes.  [facility]  REVIEW OF SYSTEMS:  Unobtainable due to dementia.    PHYSICAL EXAMINATION  VS: see VS section  GENERAL: no acute distress, normal body habitus NECK: supple, trachea midline, no neck masses, no thyroid tenderness, no thyromegaly RESPIRATORY: breathing is even & unlabored, BS CTAB CARDIAC: RRR, no murmur,no extra heart sounds,  +1 bilateral lower extremity edema GI: abdomen soft, normal BS, no masses, no tenderness, no hepatomegaly, no splenomegaly PSYCHIATRIC: the patient is alert & disoriented, affect & behavior appropriate  LABS/RADIOLOGY: 6-15 Depakote level 42 3-15 CBC and CMP normal, Depakote level 35 9-14 cbc nl, tp 5.6 ow cmp nl, depakote level 55  09/2012:  Potassium 4.1.    CBC normal.   CMP normal.   Depakote level 48.  07/2012:  Vitamin  D level 15.6.   ASSESSMENT/PLAN:  Alzheimer's dementia.  Advanced. Depakote was increased  Peripheral neuropathy.  Continue Neurontin.    Hypertension.  Well controlled.   GERD.  Well controlled.  Anxiety-on trazodone  CPT CODE: 5284199308  Newton PiggGayani Y. Kerry Doryasanayaka, MD [facility] [phone]

## 2014-02-12 ENCOUNTER — Non-Acute Institutional Stay (SKILLED_NURSING_FACILITY): Payer: Medicare Other | Admitting: Internal Medicine

## 2014-02-12 DIAGNOSIS — G309 Alzheimer's disease, unspecified: Principal | ICD-10-CM

## 2014-02-12 DIAGNOSIS — G609 Hereditary and idiopathic neuropathy, unspecified: Secondary | ICD-10-CM

## 2014-02-12 DIAGNOSIS — K219 Gastro-esophageal reflux disease without esophagitis: Secondary | ICD-10-CM

## 2014-02-12 DIAGNOSIS — G629 Polyneuropathy, unspecified: Secondary | ICD-10-CM

## 2014-02-12 DIAGNOSIS — F0280 Dementia in other diseases classified elsewhere without behavioral disturbance: Secondary | ICD-10-CM

## 2014-02-12 DIAGNOSIS — I10 Essential (primary) hypertension: Secondary | ICD-10-CM

## 2014-02-13 NOTE — Progress Notes (Signed)
Patient ID: Beth Petersen, female   DOB: [birth year], 80 y.o.   MRN: [id number]         PROGRESS NOTE  DATE:  02/12/2014   [facility]   LEVEL OF CARE: SNF  Routine Visit  CHIEF COMPLAINT:  Manage Alzheimer's dementia, hypertension and peripheral neuropathy.    HISTORY OF PRESENT ILLNESS:  REASSESSMENT OF ONGOING PROBLEM(S):  DEMENTIA:  The patient is a poor historian.  Dementia is advanced.  The dementia remains stable and continues to function adequately in the current living environment with supervision.  The patient has had little changes in behavior. No complications noted from the medications presently being used. Pt is a poor historian.  PERIPHERAL NEUROPATHY: The peripheral neuropathy is stable. The staff deny pain in the feet, tingling, and numbness. No complications noted from the medication presently being used.   HTN: Pt 's HTN remains stable.  Staff Deny CP, sob, DOE, pedal edema, headaches, dizziness or visual disturbances.  No complications from the medications currently being used.  Last BP : 100/78, 114/72, 128/80, 124/66  PAST MEDICAL HISTORY : Reviewed.  No changes.  [facility]  REVIEW OF SYSTEMS:  Unobtainable due to dementia.    PHYSICAL EXAMINATION  VS: see VS section  GENERAL: no acute distress, normal body habitus NECK: supple, trachea midline, no neck masses, no thyroid tenderness, no thyromegaly RESPIRATORY: breathing is even & unlabored, BS CTAB CARDIAC: RRR, no murmur,no extra heart sounds,  +1 bilateral lower extremity edema GI: abdomen soft, normal BS, no masses, no tenderness, no hepatomegaly, no splenomegaly PSYCHIATRIC: the patient is alert & disoriented, affect & behavior appropriate  LABS/RADIOLOGY: 6-15 Depakote level 42 3-15 CBC and CMP normal, Depakote level 35 9-14 cbc nl, tp 5.6 ow cmp nl, depakote level 55  09/2012:  Potassium 4.1.    CBC normal.   CMP normal.   Depakote level 48.  07/2012:   Vitamin D level 15.6.   ASSESSMENT/PLAN:  Alzheimer's dementia.  Advanced.   Peripheral neuropathy.  Continue Neurontin.    Hypertension.  Well controlled.   GERD.  Well controlled.  Anxiety-on trazodone  CPT CODE: 4098199308  Newton PiggGayani Y. Kerry Doryasanayaka, MD [facility] [phone]

## 2014-03-23 ENCOUNTER — Non-Acute Institutional Stay (SKILLED_NURSING_FACILITY): Payer: Medicare Other | Admitting: Internal Medicine

## 2014-03-23 DIAGNOSIS — I10 Essential (primary) hypertension: Secondary | ICD-10-CM

## 2014-03-23 DIAGNOSIS — G629 Polyneuropathy, unspecified: Secondary | ICD-10-CM

## 2014-03-23 DIAGNOSIS — K219 Gastro-esophageal reflux disease without esophagitis: Secondary | ICD-10-CM

## 2014-03-23 DIAGNOSIS — G309 Alzheimer's disease, unspecified: Principal | ICD-10-CM

## 2014-03-23 DIAGNOSIS — F0280 Dementia in other diseases classified elsewhere without behavioral disturbance: Secondary | ICD-10-CM

## 2014-03-23 DIAGNOSIS — G609 Hereditary and idiopathic neuropathy, unspecified: Secondary | ICD-10-CM

## 2014-03-23 NOTE — Progress Notes (Signed)
Patient ID: Miraya Cudney, female   DOB: 1927-07-02, 80 y.o.   MRN: [id number]         PROGRESS NOTE  DATE:  03/23/2014   [facility]   LEVEL OF CARE: SNF  Routine Visit  CHIEF COMPLAINT:  Manage Alzheimer's dementia, hypertension and peripheral neuropathy.    HISTORY OF PRESENT ILLNESS:  REASSESSMENT OF ONGOING PROBLEM(S):  DEMENTIA:  The patient is a poor historian.  Dementia is advanced.  The dementia remains stable and continues to function adequately in the current living environment with supervision.  The patient has had little changes in behavior. No complications noted from the medications presently being used. Pt is a poor historian.  PERIPHERAL NEUROPATHY: The peripheral neuropathy is stable. The staff deny pain in the feet, tingling, and numbness. No complications noted from the medication presently being used.   HTN: Pt 's HTN remains stable.  Staff Deny CP, sob, DOE, pedal edema, headaches, dizziness or visual disturbances.  No complications from the medications currently being used.  Last BP : 100/78, 114/72, 128/80, 124/66, 104/72  PAST MEDICAL HISTORY : Reviewed.  No changes.  CURRENT MEDICATIONS: Reviewed per [facility]  REVIEW OF SYSTEMS:  Unobtainable due to dementia.    PHYSICAL EXAMINATION  VS: see VS section  GENERAL: no acute distress, normal body habitus NECK: supple, trachea midline, no neck masses, no thyroid tenderness, no thyromegaly RESPIRATORY: breathing is even & unlabored, BS CTAB CARDIAC: RRR, no murmur,no extra heart sounds,  +1 bilateral lower extremity edema GI: abdomen soft, normal BS, no masses, no tenderness, no hepatomegaly, no splenomegaly PSYCHIATRIC: the patient is alert & disoriented, affect & behavior appropriate  LABS/RADIOLOGY: 9-15 CBC and CMP normal, Depakote level 75 6-15 Depakote level 42 3-15 CBC and CMP normal, Depakote level 35 9-14 cbc nl, tp 5.6 ow cmp nl, depakote level 55  09/2012:  Potassium 4.1.    CBC normal.    CMP normal.   Depakote level 48.  07/2012:  Vitamin D level 15.6.   ASSESSMENT/PLAN:  Alzheimer's dementia.  Advanced.   Peripheral neuropathy.  Continue Neurontin.    Hypertension.  Well controlled.   GERD.  Well controlled.  Anxiety-on trazodone  Depression-continue Remeron  CPT CODE: 40981  Newton Pigg. Kerry Dory, MD [facility] [phone]

## 2014-04-18 ENCOUNTER — Non-Acute Institutional Stay (SKILLED_NURSING_FACILITY): Payer: Medicare Other | Admitting: Internal Medicine

## 2014-04-18 DIAGNOSIS — I10 Essential (primary) hypertension: Secondary | ICD-10-CM

## 2014-04-18 DIAGNOSIS — G629 Polyneuropathy, unspecified: Secondary | ICD-10-CM

## 2014-04-18 DIAGNOSIS — F0280 Dementia in other diseases classified elsewhere without behavioral disturbance: Secondary | ICD-10-CM

## 2014-04-18 DIAGNOSIS — K219 Gastro-esophageal reflux disease without esophagitis: Secondary | ICD-10-CM

## 2014-04-18 DIAGNOSIS — G309 Alzheimer's disease, unspecified: Secondary | ICD-10-CM

## 2014-04-20 NOTE — Progress Notes (Signed)
Patient ID: Beth Petersen, female   DOB: 10/19/1926, 80 y.o.   MRN: [id number]         PROGRESS NOTE  DATE:  04/18/2014   [facility]   LEVEL OF CARE: SNF  Routine Visit  CHIEF COMPLAINT:  Manage Alzheimer's dementia, hypertension and peripheral neuropathy.    HISTORY OF PRESENT ILLNESS:  REASSESSMENT OF ONGOING PROBLEM(S):  DEMENTIA:  The patient is a poor historian.  Dementia is advanced.  The dementia remains stable and continues to function adequately in the current living environment with supervision.  The patient has had little changes in behavior. No complications noted from the medications presently being used. Pt is a poor historian.  PERIPHERAL NEUROPATHY: The peripheral neuropathy is stable. The staff deny pain in the feet, tingling, and numbness. No complications noted from the medication presently being used.   HTN: Pt 's HTN remains stable.  Staff Deny CP, sob, DOE, pedal edema, headaches, dizziness or visual disturbances.  No complications from the medications currently being used.  Last BP : 100/78, 114/72, 128/80, 124/66, 104/72, 104/78  PAST MEDICAL HISTORY : Reviewed.  No changes.  [facility]S: [facility]  REVIEW OF SYSTEMS:  Unobtainable due to dementia.    PHYSICAL EXAMINATION  VS: see VS section  GENERAL: no acute distress, normal body habitus NECK: supple, trachea midline, no neck masses, no thyroid tenderness, no thyromegaly RESPIRATORY: breathing is even & unlabored, BS CTAB CARDIAC: RRR, no murmur,no extra heart sounds,  +1 bilateral lower extremity edema GI: abdomen soft, normal BS, no masses, no tenderness, no hepatomegaly, no splenomegaly PSYCHIATRIC: the patient is alert & disoriented, affect & behavior appropriate  LABS/RADIOLOGY: 9-15 CBC and CMP normal, Depakote level 75 6-15 Depakote level 42 3-15 CBC and CMP normal, Depakote level 35 9-14 cbc nl, tp 5.6 ow cmp nl, depakote level 55  09/2012:  Potassium 4.1.     CBC normal.   CMP normal.   Depakote level 48.  07/2012:  Vitamin D level 15.6.   ASSESSMENT/PLAN:  Alzheimer's dementia.  Advanced.   Peripheral neuropathy.  Continue Neurontin.    Hypertension.  Well controlled.   GERD.  Well controlled.  Anxiety-on trazodone  Depression-continue Remeron  CPT CODE: [id number]  Newton PiggGayani Y. Kerry Doryasanayaka, MD [facility] [phone]

## 2016-10-10 ENCOUNTER — Encounter (HOSPITAL_COMMUNITY): Payer: Self-pay | Admitting: Emergency Medicine

## 2016-10-10 ENCOUNTER — Emergency Department (HOSPITAL_COMMUNITY)
Admission: EM | Admit: 2016-10-10 | Discharge: 2016-10-10 | Disposition: A | Discharge status: Home / Self Care | Payer: Medicare Other | Attending: Emergency Medicine | Admitting: Emergency Medicine

## 2016-10-10 ENCOUNTER — Emergency Department (HOSPITAL_COMMUNITY): Payer: Medicare Other

## 2016-10-10 DIAGNOSIS — Z79899 Other long term (current) drug therapy: Secondary | ICD-10-CM | POA: Insufficient documentation

## 2016-10-10 DIAGNOSIS — G309 Alzheimer's disease, unspecified: Secondary | ICD-10-CM | POA: Diagnosis not present

## 2016-10-10 DIAGNOSIS — Y999 Unspecified external cause status: Secondary | ICD-10-CM | POA: Diagnosis not present

## 2016-10-10 DIAGNOSIS — W19XXXA Unspecified fall, initial encounter: Secondary | ICD-10-CM

## 2016-10-10 DIAGNOSIS — Z23 Encounter for immunization: Secondary | ICD-10-CM | POA: Diagnosis not present

## 2016-10-10 DIAGNOSIS — W050XXA Fall from non-moving wheelchair, initial encounter: Secondary | ICD-10-CM | POA: Insufficient documentation

## 2016-10-10 DIAGNOSIS — Y939 Activity, unspecified: Secondary | ICD-10-CM | POA: Insufficient documentation

## 2016-10-10 DIAGNOSIS — Y92129 Unspecified place in nursing home as the place of occurrence of the external cause: Secondary | ICD-10-CM | POA: Insufficient documentation

## 2016-10-10 DIAGNOSIS — N189 Chronic kidney disease, unspecified: Secondary | ICD-10-CM | POA: Diagnosis not present

## 2016-10-10 DIAGNOSIS — S0101XA Laceration without foreign body of scalp, initial encounter: Secondary | ICD-10-CM | POA: Diagnosis not present

## 2016-10-10 DIAGNOSIS — I129 Hypertensive chronic kidney disease with stage 1 through stage 4 chronic kidney disease, or unspecified chronic kidney disease: Secondary | ICD-10-CM | POA: Insufficient documentation

## 2016-10-10 DIAGNOSIS — S0990XA Unspecified injury of head, initial encounter: Secondary | ICD-10-CM | POA: Diagnosis present

## 2016-10-10 MED ORDER — TETANUS-DIPHTH-ACELL PERTUSSIS 5-2.5-18.5 LF-MCG/0.5 IM SUSP
0.5000 mL | Freq: Once | INTRAMUSCULAR | Status: AC
  Administered 2016-10-10: 0.5 mL via INTRAMUSCULAR
  Filled 2016-10-10: qty 0.5

## 2016-10-10 MED ORDER — BACITRACIN ZINC 500 UNIT/GM EX OINT
TOPICAL_OINTMENT | CUTANEOUS | Status: AC
  Administered 2016-10-10: 1
  Filled 2016-10-10: qty 0.9

## 2016-10-10 MED ORDER — LIDOCAINE-EPINEPHRINE (PF) 2 %-1:200000 IJ SOLN
10.0000 mL | Freq: Once | INTRAMUSCULAR | Status: AC
  Administered 2016-10-10: 10 mL
  Filled 2016-10-10: qty 20

## 2016-10-10 NOTE — ED Provider Notes (Signed)
[facility]-EMERGENCY DEPT Provider Note   CSN: [id number] Arrival date & time: 10/10/16  1353     History   Chief Complaint Chief Complaint  Patient presents with  . Laceration    HPI Beth Petersen is a 83 y.o. female.  HPI  83 y.o. female with a hx of Alzheimer Disease, CKD, presents to the Emergency Department today from [facility] due to mechanical fall after leaning forward too far from wheelchair. This was witnessed by staff. Noted head trauma without LOC. No emesis. Pt baseline per daughter in room. Suffered laceration to forehead around 2 inches. Pt not on blood thinners. Pt is non verbal at baseline.   Level V Caveat: Dementia  Past Medical History:  Diagnosis Date  . Alzheimer disease   . Anxiety   . Carpal tunnel syndrome   . Cervical os stenosis   . Chronic kidney disease    hx of nephrolithasis  . Depression   . Dysphagia   . Esophageal reflux 12/16/2012  . Essential hypertension, benign 12/16/2012  . Restless leg syndrome   . Unspecified hereditary and idiopathic peripheral neuropathy 12/16/2012    Patient Active Problem List   Diagnosis Date Noted  . Depression with anxiety 03/14/2013  . Peripheral neuropathy (HCC) 03/14/2013  . Alzheimer's disease 12/16/2012  . Essential hypertension, benign 12/16/2012  . Esophageal reflux 12/16/2012    History reviewed. No pertinent surgical history.  OB History    No data available       Home Medications    Prior to Admission medications   Medication Sig Start Date End Date Taking? Authorizing Provider  acetaminophen (TYLENOL) 325 MG tablet Take 650 mg by mouth every 4 (four) hours as needed.    Historical Provider, MD  amLODipine (NORVASC) 5 MG tablet Take 5 mg by mouth daily.    Historical Provider, MD  divalproex (DEPAKOTE SPRINKLE) 125 MG capsule Take 375 mg by mouth 2 (two) times daily.     Historical Provider, MD  gabapentin (NEURONTIN) 600 MG tablet Take 600 mg by mouth 3 (three) times daily.    Historical  Provider, MD  memantine (NAMENDA) 5 MG tablet Take 5 mg by mouth 2 (two) times daily.    Historical Provider, MD  omeprazole (PRILOSEC) 20 MG capsule Take 20 mg by mouth daily.    Historical Provider, MD    Family History History reviewed. No pertinent family history.  Social History Social History  Substance Use Topics  . Smoking status: Never Smoker  . Smokeless tobacco: Not on file  . Alcohol use Not on file     Allergies   Aricept [donepezil hcl]; Avelox [moxifloxacin hcl in nacl]; and Penicillins   Review of Systems Review of Systems  Unable to perform ROS: Dementia   Physical Exam Updated Vital Signs BP 120/80 (BP Location: Left Arm)   Pulse 70   Temp 97.8 F (36.6 C) (Oral)   Resp 20   Ht  (1.549 m)   Wt 68.9 kg   SpO2 100%   BMI 28.72 kg/m   Physical Exam  Constitutional: She is oriented to person, place, and time. Vital signs are normal. She appears well-developed and well-nourished. No distress.  HENT:  Head: Normocephalic and atraumatic. Head is without raccoon's eyes and without Battle's sign.  Right Ear: Hearing normal. No hemotympanum.  Left Ear: Hearing normal. No hemotympanum.  Nose: Nose normal.  Mouth/Throat: Uvula is midline, oropharynx is clear and moist and mucous membranes are normal.  Eyes: Conjunctivae and  EOM are normal. Pupils are equal, round, and reactive to light.  Pupils equal and reactive  Neck: Trachea normal and normal range of motion. Neck supple. No spinous process tenderness and no muscular tenderness present. No tracheal deviation and normal range of motion present.  Cardiovascular: Normal rate, regular rhythm, S1 normal, S2 normal, normal heart sounds, intact distal pulses and normal pulses.   Pulmonary/Chest: Effort normal and breath sounds normal. No respiratory distress. She has no decreased breath sounds. She has no wheezes. She has no rhonchi. She has no rales.  Abdominal: Soft. Normal appearance and bowel sounds are  normal. There is no tenderness. There is no rigidity and no guarding.  Musculoskeletal: Normal range of motion.  Moving x 4 extremities without difficulty  Neurological: She is alert and oriented to person, place, and time. She has normal strength. No cranial nerve deficit or sensory deficit.  Skin: Skin is warm and dry.  2 inch laceration across forehead. Bottom of wound visualized. Bleeding controlled.   Psychiatric: She has a normal mood and affect. Her speech is normal and behavior is normal. Thought content normal.  Nursing note and vitals reviewed.  ED Treatments / Results  Labs (all labs ordered are listed, but only abnormal results are displayed) Labs Reviewed - No data to display  EKG  EKG Interpretation None       Radiology Ct Head Wo Contrast  Result Date: 10/10/2016 CLINICAL DATA:  Fall with forehead laceration. Dementia. Initial encounter. EXAM: CT HEAD WITHOUT CONTRAST CT CERVICAL SPINE WITHOUT CONTRAST TECHNIQUE: Multidetector CT imaging of the head and cervical spine was performed following the standard protocol without intravenous contrast. Multiplanar CT image reconstructions of the cervical spine were also generated. COMPARISON:  None. FINDINGS: CT HEAD FINDINGS Brain: No evidence of acute infarction, hemorrhage, hydrocephalus, extra-axial collection or mass lesion/mass effect. Atrophy with ventriculomegaly. Medial temporal volume loss is severe, correlating with history of Alzheimer's disease. Vascular: No hyperdense vessel or unexpected calcification. Skull: Left forehead laceration without underlying fracture or opaque foreign body. Sinuses/Orbits: The majority of the right sphenoid sinus is opacified, without expansion. CT CERVICAL SPINE FINDINGS Alignment: No traumatic malalignment. Skull base and vertebrae: Negative for fracture Soft tissues and spinal canal: No prevertebral fluid or swelling. No visible canal hematoma. Disc levels: Facet arthropathy with ankylosis on  the left from C2-C4. Spondylosis with bulky lower endplate spurring. Upper chest: No acute finding IMPRESSION: 1. No evidence of intracranial or cervical spine injury. 2. Forehead laceration without fracture. 3. Brain atrophy in keeping with history of Alzheimer's disease. Electronically Signed   By: Marnee Spring M.D.   On: 10/10/2016 15:51   Ct Cervical Spine Wo Contrast  Result Date: 10/10/2016 CLINICAL DATA:  Fall with forehead laceration. Dementia. Initial encounter. EXAM: CT HEAD WITHOUT CONTRAST CT CERVICAL SPINE WITHOUT CONTRAST TECHNIQUE: Multidetector CT imaging of the head and cervical spine was performed following the standard protocol without intravenous contrast. Multiplanar CT image reconstructions of the cervical spine were also generated. COMPARISON:  None. FINDINGS: CT HEAD FINDINGS Brain: No evidence of acute infarction, hemorrhage, hydrocephalus, extra-axial collection or mass lesion/mass effect. Atrophy with ventriculomegaly. Medial temporal volume loss is severe, correlating with history of Alzheimer's disease. Vascular: No hyperdense vessel or unexpected calcification. Skull: Left forehead laceration without underlying fracture or opaque foreign body. Sinuses/Orbits: The majority of the right sphenoid sinus is opacified, without expansion. CT CERVICAL SPINE FINDINGS Alignment: No traumatic malalignment. Skull base and vertebrae: Negative for fracture Soft tissues and spinal canal: No prevertebral  fluid or swelling. No visible canal hematoma. Disc levels: Facet arthropathy with ankylosis on the left from C2-C4. Spondylosis with bulky lower endplate spurring. Upper chest: No acute finding IMPRESSION: 1. No evidence of intracranial or cervical spine injury. 2. Forehead laceration without fracture. 3. Brain atrophy in keeping with history of Alzheimer's disease. Electronically Signed   By: Marnee Spring M.D.   On: 10/10/2016 15:51    Procedures .Marland KitchenLaceration Repair Date/Time: 10/10/2016  3:14 PM Performed by: Audry Pili Authorized by: Audry Pili   Consent:    Consent given by:  Guardian   Alternatives discussed:  No treatment Anesthesia (see MAR for exact dosages):    Anesthesia method:  Local infiltration   Local anesthetic:  Lidocaine 1% WITH epi Laceration details:    Location:  Scalp   Scalp location:  Frontal   Length (cm):  5 Pre-procedure details:    Preparation:  Patient was prepped and draped in usual sterile fashion and imaging obtained to evaluate for foreign bodies Exploration:    Hemostasis achieved with:  Direct pressure   Wound exploration: entire depth of wound probed and visualized   Treatment:    Area cleansed with:  Betadine, Hibiclens and saline   Amount of cleaning:  Extensive   Irrigation solution:  Sterile saline   Irrigation method:  Pressure wash Skin repair:    Repair method:  Sutures   Suture size:  4-0   Suture material:  Prolene   Suture technique:  Simple interrupted   Number of sutures:  7 Post-procedure details:    Dressing:  Antibiotic ointment and non-adherent dressing   Patient tolerance of procedure:  Tolerated well, no immediate complications   (including critical care time)  Medications Ordered in ED Medications  lidocaine-EPINEPHrine (XYLOCAINE W/EPI) 2 %-1:200000 (PF) injection 10 mL (not administered)  Tdap (BOOSTRIX) injection 0.5 mL (not administered)  bacitracin 500 UNIT/GM ointment (not administered)     Initial Impression / Assessment and Plan / ED Course  I have reviewed the triage vital signs and the nursing notes.  Pertinent labs & imaging results that were available during my care of the patient were reviewed by me and considered in my medical decision making (see chart for details).  Final Clinical Impressions(s) / ED Diagnoses   {I have reviewed and evaluated the relevant imaging studies.  {I have reviewed the relevant previous healthcare records.  {I obtained HPI from historian. {Patient  discussed with supervising physician.  ED Course:  Assessment: Pt is a 83 y.o. female with hx Dementia who presents with mechanical fall from [facility] nursing facility. Noted leaning forward on wheelchair and falling forward. Witnessed. No LOC. Suffered laceration on forehead. Bleeding controlled with direct pressure. Pt not on anticoagulation. On exam, pt in NAD. Nontoxic/nonseptic appearing. VSS. Afebrile. Lungs CTA. Heart RRR. Abdomen nontender soft. Laceration 2 inches long. Bottom of wound visualized. CT Head/C Spine unremarkable. Tdap booster given. Pressure irrigation performed. Bottom of the wound visualized with bleeding controled. Laceration occurred < 8 hours prior to repair which was well tolerated. Pt has no co morbidities to effect normal wound healing. Discussed suture home care w pt and answered questions. Pt to f-u for wound check and suture removal in 5-8 days. Pt is hemodynamically stable w no complaints prior to dc.   Disposition/Plan:  DC Home Additional Verbal discharge instructions given and discussed with patient.  Pt Instructed to f/u with PCP in the next week for evaluation and treatment of symptoms. Return precautions given Pt acknowledges  and agrees with plan  Supervising Physician Lyndal Pulley, MD  Final diagnoses:  Fall, initial encounter  Laceration of scalp, initial encounter    New Prescriptions New Prescriptions   No medications on file     Audry Pili, PA-C 10/10/16 1600    Lyndal Pulley, MD 10/11/16 [phone]

## 2016-10-10 NOTE — Discharge Instructions (Signed)
Please read and follow all provided instructions.  Your diagnoses today include:  1. Fall, initial encounter   2. Laceration of scalp, initial encounter    Tests performed today include: CT of the affected area that did not show any foreign bodies or broken bones Vital signs. See below for your results today.   Medications prescribed:   Take any prescribed medications only as directed.   Home care instructions:  Follow any educational materials and wound care instructions contained in this packet.   You may shower and wash the area with soap and water, just be sure to pat the area dry and not rub over the stitches. Do no put your stiches underwater (in a bath, pool, or lake). Getting stiches wet can slow down healing and increase your chances of getting an infection. You may apply Bacitracin or Neosporin twice a day for 7 days, and keep the ara clean with  bandage or gauze. Do not apply alcohol or hydrogen peroxide. Cover the area if it draining or weeping.   Follow-up instructions: Suture Removal: Return to the Emergency Department or see your primary care care doctor in 5-8 days for a recheck of your wound and removal of your sutures or staples.    Return instructions:  Return to the Emergency Department if you have: Fever Worsening pain Worsening swelling of the wound Pus draining from the wound Redness of the skin that moves away from the wound, especially if it streaks away from the affected area  Any other emergent concerns  Your vital signs today were: BP 120/80 (BP Location: Left Arm)    Pulse 70    Temp 97.8 F (36.6 C) (Oral)    Resp 20    Ht  (1.549 m)    Wt 68.9 kg    SpO2 100%    BMI 28.72 kg/m  If your blood pressure (BP) was elevated above 135/85 this visit, please have this repeated by your doctor within one month. --------------

## 2016-10-10 NOTE — ED Triage Notes (Signed)
Per EMS, pt is from [facility] and has a hx of dementia. Pt was in wheelchair at facility, leaned forward, and fell out of chair, and sustained a laceration on forehead. Pt is not on blood thinners and did not lose consciousness.  Presents with 2" lac to forehead that appears to be to the bone.  Pt is nonverbal at baseline.  No obvious distress at this time.

## 2016-10-10 NOTE — ED Notes (Signed)
Bed: ZO10 Expected date:  Expected time:  Means of arrival:  Comments: 83 yo fall, head lac

## 2017-11-03 DEATH — deceased

## 2018-06-29 IMAGING — CT CT HEAD W/O CM
6 of 8 series · 17 of 47 positions shown, 18 images · non-contrast
Comparison: None.

CLINICAL DATA: Fall with forehead laceration. Dementia. Initial
encounter.

EXAM:
CT HEAD WITHOUT CONTRAST
CT CERVICAL SPINE WITHOUT CONTRAST
TECHNIQUE: Multidetector CT imaging of the head and cervical spine was
performed following the standard protocol without intravenous
contrast. Multiplanar CT image reconstructions of the cervical spine
were also generated.

[Series 3: head w/o · axial · non-contrast · 0.43mm/px · z∈[+1037,+1057]mm · 2 of 14 slices shown (1 of 2)]
[im 5/14  brain]
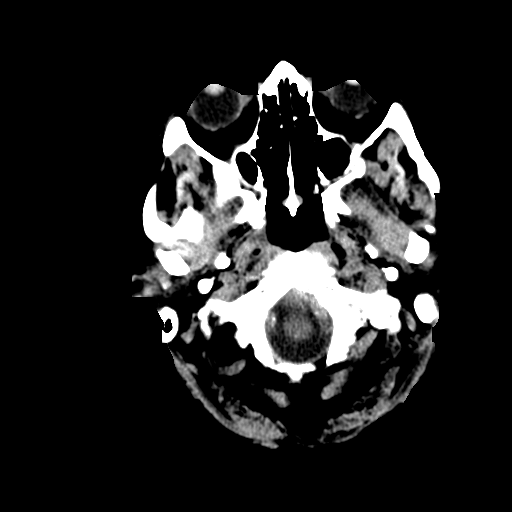
[im 9/14  brain]
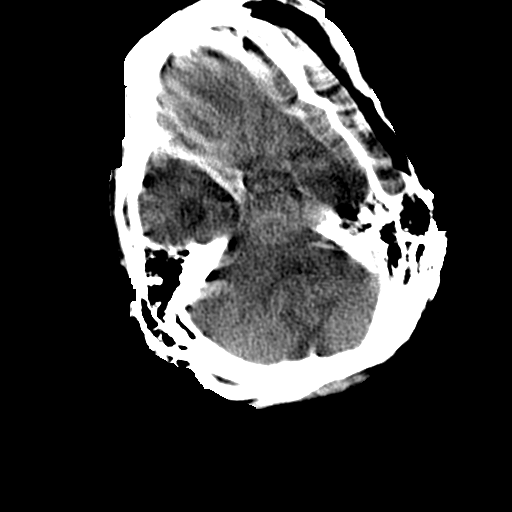

[Series 5: head w/o · axial · non-contrast · 0.43mm/px · z∈[+1054,+1144]mm · 4 of 30 slices shown, 5 images (2 of 2)]
[im 6/30  brain]
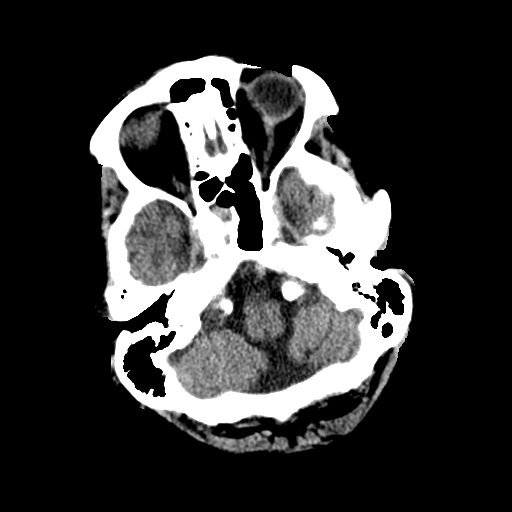
[im 6/30  bone]
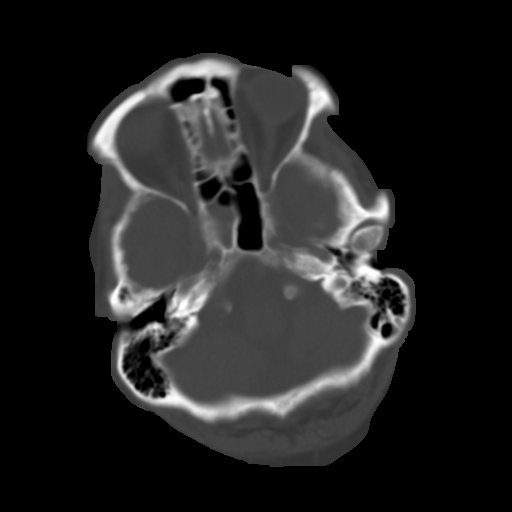
[im 12/30  brain]
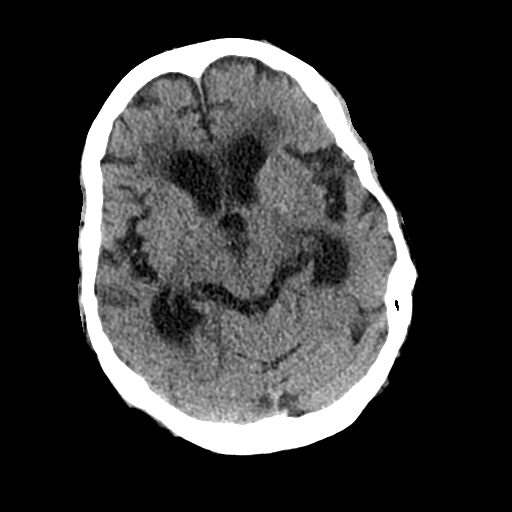
[im 18/30  brain]
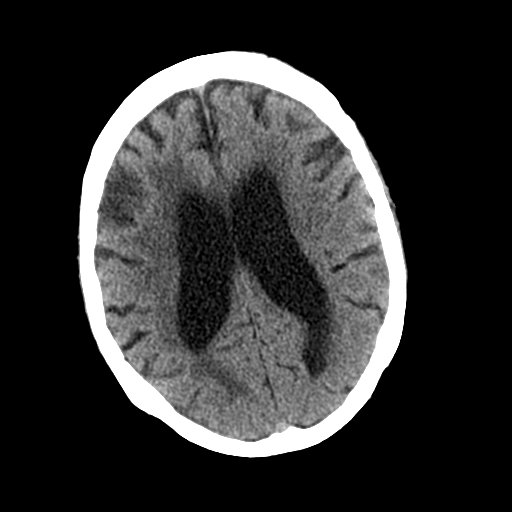
[im 24/30  brain]
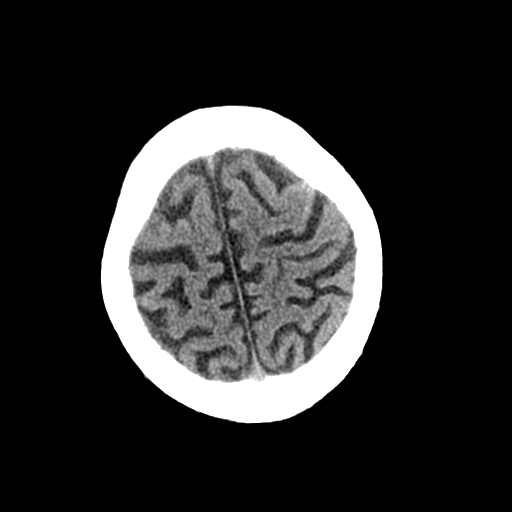

[Series 6: bone windows · axial · 0.43mm/px · z∈[+1054,+1144]mm · 4 of 30 slices shown]
[im 6/30  bone]
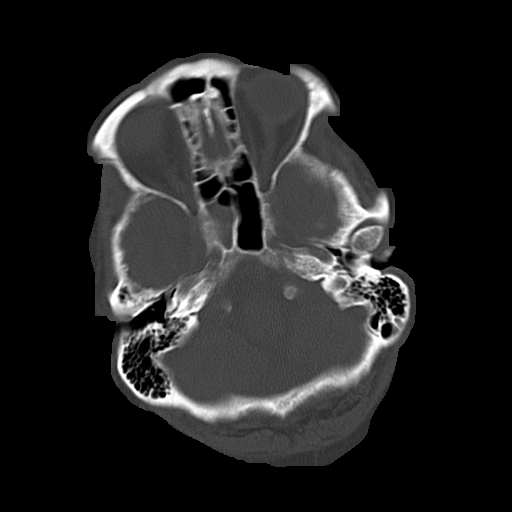
[im 12/30  bone]
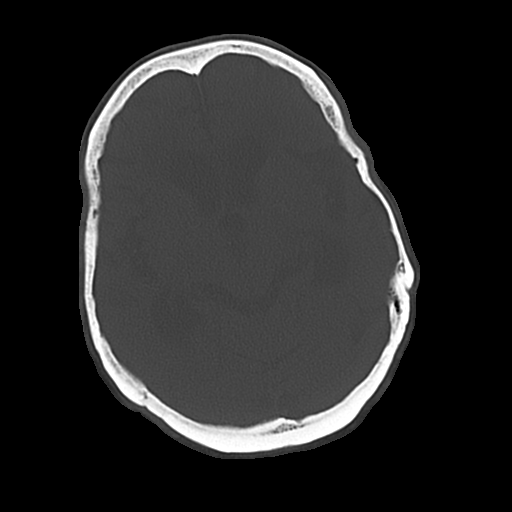
[im 18/30  bone]
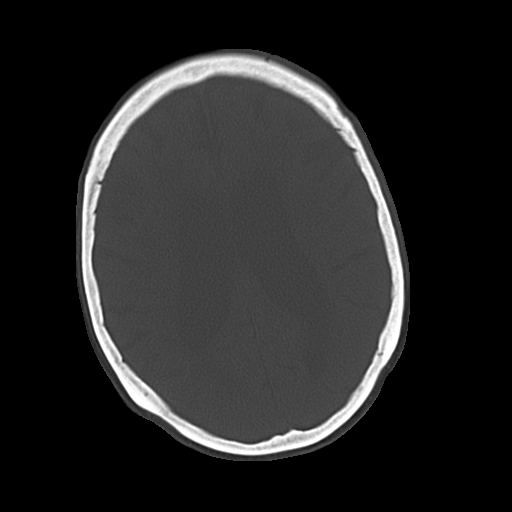
[im 24/30  bone]
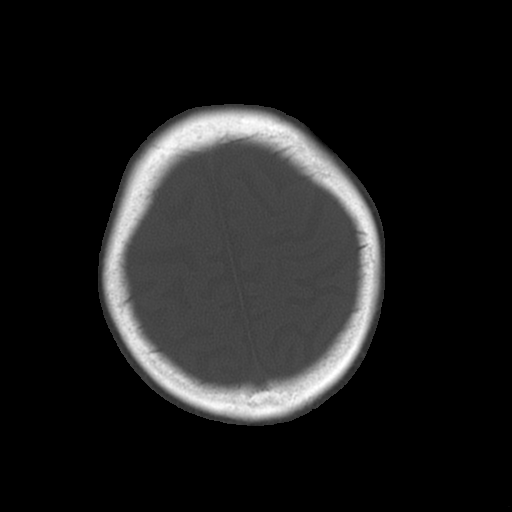

[Series 8: coronal · coronal · 0.31mm/px · 3 of 74 slices shown]
[im 25/74  brain]
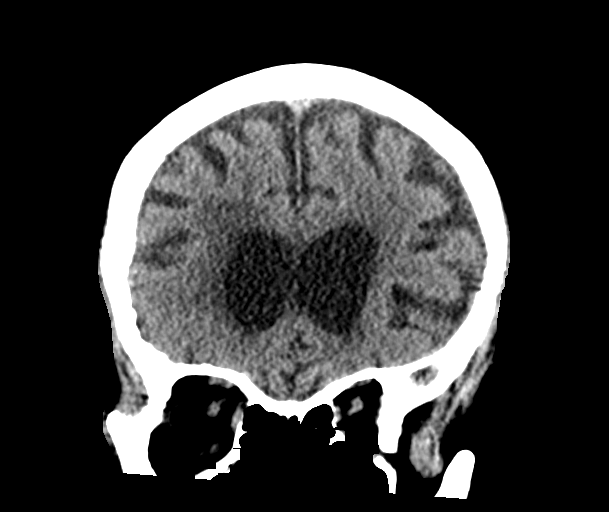
[im 33/74  brain]
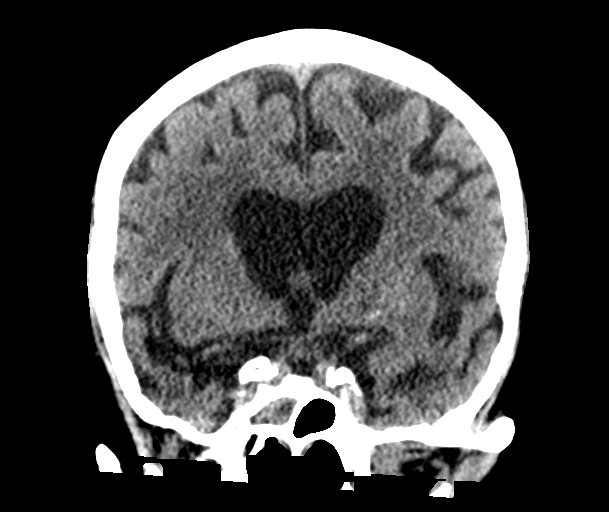
[im 41/74  brain]
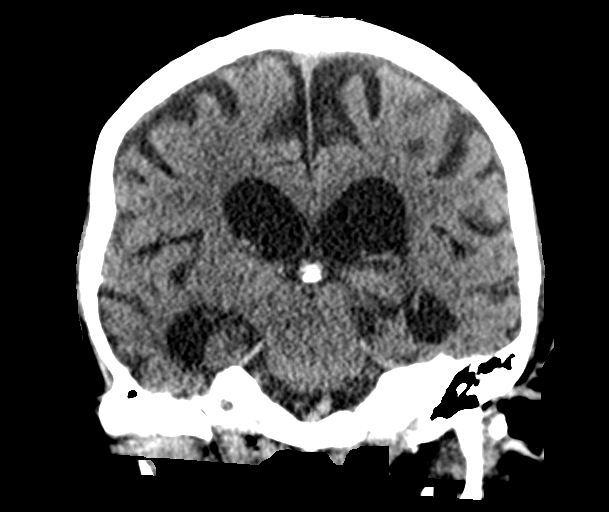

[Series 9: sagittal · sagittal · 0.33mm/px · 2 of 70 slices shown]
[im 24/70  brain]
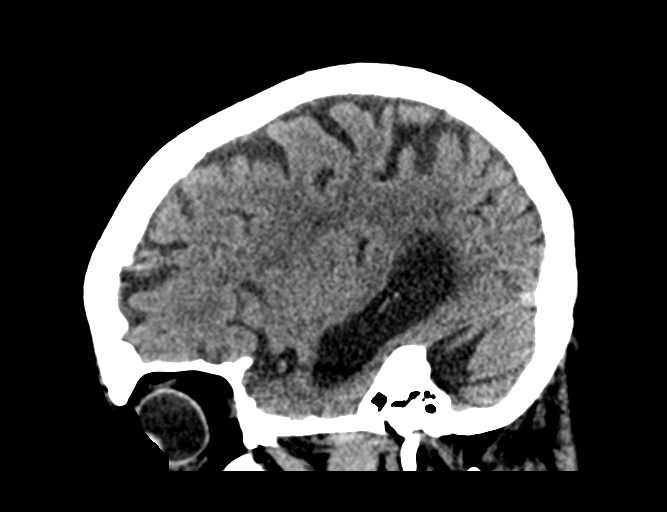
[im 47/70  brain]
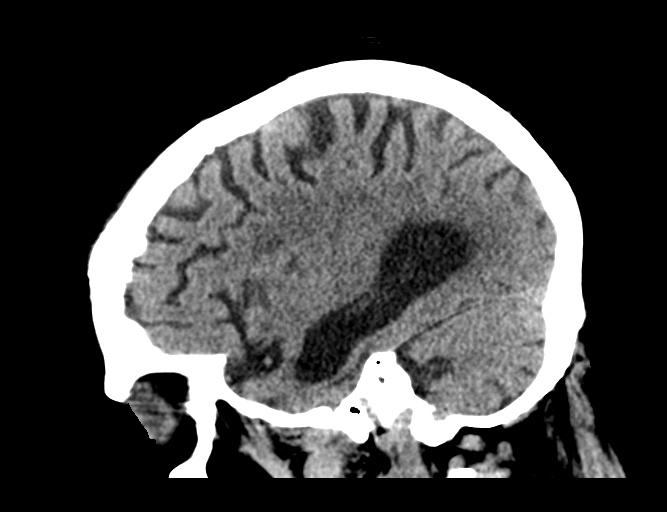

[Series 10: c-spine st · axial · 0.39mm/px · z∈[+939,+961]mm · 2 of 66 slices shown]
[im 6/66  brain]
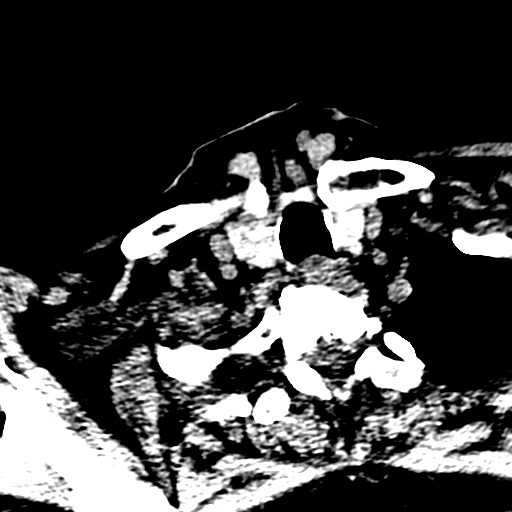
[im 17/66  brain]
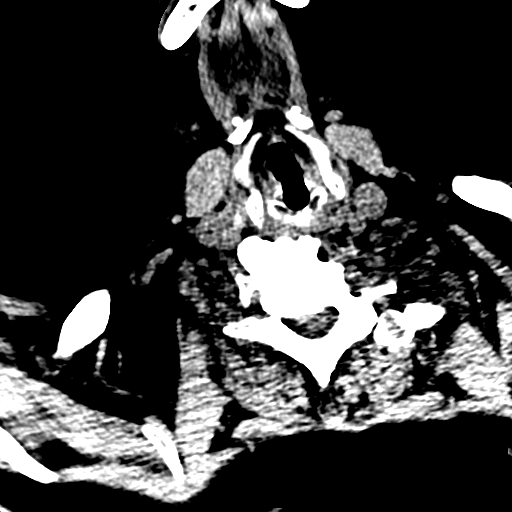

[17 of 47 positions shown; findings below may reference images not displayed]

FINDINGS: CT HEAD FINDINGS

Brain: No evidence of acute infarction, hemorrhage, hydrocephalus,
extra-axial collection or mass lesion/mass effect. Atrophy with
ventriculomegaly. Medial temporal volume loss is severe, correlating
with history of Alzheimer's disease.

Vascular: No hyperdense vessel or unexpected calcification.

Skull: Left forehead laceration without underlying fracture or
opaque foreign body.

Sinuses/Orbits: The majority of the right sphenoid sinus is
opacified, without expansion.

CT CERVICAL SPINE FINDINGS

Alignment: No traumatic malalignment.

Skull base and vertebrae: Negative for fracture

Soft tissues and spinal canal: No prevertebral fluid or swelling. No
visible canal hematoma.

Disc levels: Facet arthropathy with ankylosis on the left from
C2-C4. Spondylosis with bulky lower endplate spurring.

Upper chest: No acute finding
IMPRESSION: 1. No evidence of intracranial or cervical spine injury.
2. Forehead laceration without fracture.
3. Brain atrophy in keeping with history of Alzheimer's disease.
